# Patient Record
Sex: Male | Born: 1965 | Race: Black or African American | Hispanic: No | Marital: Single | Smoking: Never smoker
Health system: Southern US, Community
[De-identification: ages and names within clinical notes are randomized; demographics above are authoritative.]

---

## 2014-02-22 ENCOUNTER — Encounter (HOSPITAL_COMMUNITY): Payer: Self-pay | Admitting: Emergency Medicine

## 2014-02-22 ENCOUNTER — Emergency Department (HOSPITAL_COMMUNITY)
Admission: EM | Admit: 2014-02-22 | Discharge: 2014-02-22 | Disposition: A | Discharge status: Home / Self Care | Payer: 59 | Attending: Emergency Medicine | Admitting: Emergency Medicine

## 2014-02-22 DIAGNOSIS — IMO0002 Reserved for concepts with insufficient information to code with codable children: Secondary | ICD-10-CM | POA: Insufficient documentation

## 2014-02-22 DIAGNOSIS — T169XXA Foreign body in ear, unspecified ear, initial encounter: Secondary | ICD-10-CM | POA: Insufficient documentation

## 2014-02-22 DIAGNOSIS — Y939 Activity, unspecified: Secondary | ICD-10-CM | POA: Insufficient documentation

## 2014-02-22 DIAGNOSIS — T162XXA Foreign body in left ear, initial encounter: Secondary | ICD-10-CM

## 2014-02-22 DIAGNOSIS — Y929 Unspecified place or not applicable: Secondary | ICD-10-CM | POA: Diagnosis not present

## 2014-02-22 NOTE — ED Notes (Signed)
Declined W/C at D/C and was escorted to lobby by RN. 

## 2014-02-22 NOTE — ED Provider Notes (Addendum)
CSN: [id number]     Arrival date & time 02/22/14  0815 History  This chart was scribed for Elpidio Anis, PA, working with Arby Barrette, MD found by Elon Spanner, ED Scribe. This patient was seen in room TR07C/TR07C and the patient's care was started at 9:00 AM   Chief Complaint  Patient presents with  . Foreign Body in Ear   Patient is a 48 y.o. male presenting with foreign body in ear. The history is provided by the patient. No language interpreter was used.  Foreign Body in Ear This is a new problem. The current episode started 1 to 2 hours ago. The problem occurs constantly. The problem has not changed since onset.Nothing aggravates the symptoms. Nothing relieves the symptoms.  Foreign Body in Ear This is a new problem. The current episode started 1 to 2 hours ago. The problem occurs constantly. The problem has not changed since onset.Nothing aggravates the symptoms.    HPI Comments: [name] [name] is a 48 y.o. male who presents to the Emergency Department complaining of a foreign body in the left ear consisting of a Q-tip onset this morning.  Patient denies otalgia.   History reviewed. No pertinent past medical history. History reviewed. No pertinent past surgical history. History reviewed. No pertinent family history. History  Substance Use Topics  . Smoking status: Never Smoker   . Smokeless tobacco: Never Used  . Alcohol Use: Yes     Comment: social    Review of Systems  HENT:       See HPI.    Allergies  Review of patient's allergies indicates no known allergies.  Home Medications   Prior to Admission medications   Not on File   BP 168/97  Pulse 87  Temp(Src) 97.7 F (36.5 C) (Oral)  Resp 12  Ht  (1.626 m)  Wt 191 lb (86.637 kg)  BMI 32.77 kg/m2  SpO2 100% Physical Exam  Nursing note and vitals reviewed. Constitutional: He is oriented to person, place, and time. He appears well-developed and well-nourished. No distress.  HENT:  Head: Normocephalic and  atraumatic.  Left foreign body.  No bleeding in the canal.  No ear pain.  Tm post removal is unremarkable.  Tm normal. No perforation.   Eyes: Conjunctivae and EOM are normal.  Neck: Neck supple. No tracheal deviation present.  Cardiovascular: Normal rate.   Pulmonary/Chest: Effort normal. No respiratory distress.  Musculoskeletal: Normal range of motion.  Neurological: He is alert and oriented to person, place, and time.  Skin: Skin is warm and dry.  Psychiatric: He has a normal mood and affect. His behavior is normal.    ED Course  Procedures (including critical care time)  DIAGNOSTIC STUDIES: Oxygen Saturation is 100% on RA, normal by my interpretation.    COORDINATION OF CARE:  9:05 AM Discussed treatment plan with patient at bedside.  Patient acknowledges and agrees with plan.    Labs Review Labs Reviewed - No data to display  Imaging Review No results found.   EKG Interpretation None     White foreign body visualized in the left external ear canal and easily removed with forceps in whole. NO bleeding.  MDM   Final diagnoses:  None    1. Foreign body left ear, removed  Uncomplicated ear foreign body with easy removal.  I personally performed the services described in this documentation, which was scribed in my presence. The recorded information has been reviewed and is accurate.      Melvenia Beam  A Adaliz Dobis, PA-C 02/22/14 0929  Arnoldo Hooker, PA-C 04/04/14 2208

## 2014-02-22 NOTE — ED Notes (Signed)
Pt reports the cotton end of a Q-tip is in LT ear.

## 2014-02-22 NOTE — Discharge Instructions (Signed)
Ear Foreign Body °An ear foreign body is an object that is stuck in the ear. It is common for young children to put objects into the ear canal. These may include pebbles, beads, beans, and any other small objects which will fit. In adults, objects such as cotton swabs may become lodged in the ear canal. In all ages, the most common foreign bodies are insects that enter the ear canal.  °SYMPTOMS  °Foreign bodies may cause pain, buzzing or roaring sounds, hearing loss, and ear drainage.  °HOME CARE INSTRUCTIONS  °· Keep all follow-up appointments with your caregiver as told. °· Keep small objects out of reach of young children. Tell them not to put anything in their ears. °SEEK IMMEDIATE MEDICAL CARE IF:  °· You have bleeding from the ear. °· You have increased pain or swelling of the ear. °· You have reduced hearing. °· You have discharge coming from the ear. °· You have a fever. °· You have a headache. °MAKE SURE YOU:  °· Understand these instructions. °· Will watch your condition. °· Will get help right away if you are not doing well or get worse. °Document Released: 06/03/2000 Document Revised: 08/29/2011 Document Reviewed: 01/23/2008 °ExitCare® Patient Information ©2015 [employer]. This information is not intended to replace advice given to you by your health care provider. Make sure you discuss any questions you have with your health care provider. ° °

## 2014-02-25 NOTE — ED Provider Notes (Signed)
Medical screening examination/treatment/procedure(s) were performed by non-physician practitioner and as supervising physician I was immediately available for consultation/collaboration.   EKG Interpretation None       Arby Barrette, MD 02/25/14 [phone]

## 2014-04-05 NOTE — ED Provider Notes (Signed)
Medical screening examination/treatment/procedure(s) were performed by non-physician practitioner and as supervising physician I was immediately available for consultation/collaboration.   EKG Interpretation None       Arby BarretteMarcy Kynlei Piontek, MD 04/05/14 1523

## 2014-06-26 ENCOUNTER — Encounter (HOSPITAL_COMMUNITY): Payer: Self-pay | Admitting: *Deleted

## 2014-06-26 ENCOUNTER — Emergency Department (HOSPITAL_COMMUNITY): Payer: 59

## 2014-06-26 ENCOUNTER — Emergency Department (HOSPITAL_COMMUNITY): Payer: 59 | Admitting: Anesthesiology

## 2014-06-26 ENCOUNTER — Inpatient Hospital Stay (HOSPITAL_COMMUNITY)
Admission: EM | Admit: 2014-06-26 | Discharge: 2014-07-01 | DRG: 207 | Disposition: A | Discharge status: Home / Self Care | Payer: 59 | Attending: Internal Medicine | Admitting: Internal Medicine

## 2014-06-26 ENCOUNTER — Inpatient Hospital Stay (HOSPITAL_COMMUNITY): Payer: 59

## 2014-06-26 DIAGNOSIS — Z978 Presence of other specified devices: Secondary | ICD-10-CM

## 2014-06-26 DIAGNOSIS — J0431 Supraglottitis, unspecified, with obstruction: Secondary | ICD-10-CM | POA: Diagnosis present

## 2014-06-26 DIAGNOSIS — B9689 Other specified bacterial agents as the cause of diseases classified elsewhere: Secondary | ICD-10-CM | POA: Diagnosis present

## 2014-06-26 DIAGNOSIS — N170 Acute kidney failure with tubular necrosis: Secondary | ICD-10-CM | POA: Diagnosis present

## 2014-06-26 DIAGNOSIS — R739 Hyperglycemia, unspecified: Secondary | ICD-10-CM | POA: Diagnosis not present

## 2014-06-26 DIAGNOSIS — T380X5A Adverse effect of glucocorticoids and synthetic analogues, initial encounter: Secondary | ICD-10-CM | POA: Diagnosis not present

## 2014-06-26 DIAGNOSIS — Z789 Other specified health status: Secondary | ICD-10-CM

## 2014-06-26 DIAGNOSIS — J9601 Acute respiratory failure with hypoxia: Secondary | ICD-10-CM | POA: Diagnosis present

## 2014-06-26 DIAGNOSIS — R061 Stridor: Secondary | ICD-10-CM

## 2014-06-26 DIAGNOSIS — J029 Acute pharyngitis, unspecified: Secondary | ICD-10-CM | POA: Diagnosis present

## 2014-06-26 DIAGNOSIS — N179 Acute kidney failure, unspecified: Secondary | ICD-10-CM | POA: Diagnosis not present

## 2014-06-26 DIAGNOSIS — J0510 Acute epiglottitis without obstruction: Secondary | ICD-10-CM

## 2014-06-26 DIAGNOSIS — J9690 Respiratory failure, unspecified, unspecified whether with hypoxia or hypercapnia: Secondary | ICD-10-CM | POA: Diagnosis present

## 2014-06-26 DIAGNOSIS — R0602 Shortness of breath: Secondary | ICD-10-CM

## 2014-06-26 LAB — CBC WITH DIFFERENTIAL/PLATELET
Basophils Absolute: 0 10*3/uL (ref 0.0–0.1)
Basophils Relative: 0 % (ref 0–1)
Eosinophils Absolute: 0 10*3/uL (ref 0.0–0.7)
Eosinophils Relative: 0 % (ref 0–5)
HCT: 44.1 % (ref 39.0–52.0)
Hemoglobin: 14.8 g/dL (ref 13.0–17.0)
LYMPHS ABS: 2.7 10*3/uL (ref 0.7–4.0)
Lymphocytes Relative: 14 % (ref 12–46)
MCH: 27.7 pg (ref 26.0–34.0)
MCHC: 33.6 g/dL (ref 30.0–36.0)
MCV: 82.6 fL (ref 78.0–100.0)
Monocytes Absolute: 1.4 10*3/uL — ABNORMAL HIGH (ref 0.1–1.0)
Monocytes Relative: 7 % (ref 3–12)
Neutro Abs: 15.1 10*3/uL — ABNORMAL HIGH (ref 1.7–7.7)
Neutrophils Relative %: 79 % — ABNORMAL HIGH (ref 43–77)
PLATELETS: 150 10*3/uL (ref 150–400)
RBC: 5.34 MIL/uL (ref 4.22–5.81)
RDW: 15.9 % — ABNORMAL HIGH (ref 11.5–15.5)
WBC: 19.2 10*3/uL — ABNORMAL HIGH (ref 4.0–10.5)

## 2014-06-26 LAB — RAPID URINE DRUG SCREEN, HOSP PERFORMED
Amphetamines: NOT DETECTED
BENZODIAZEPINES: NOT DETECTED
Barbiturates: NOT DETECTED
Cocaine: NOT DETECTED
OPIATES: NOT DETECTED
Tetrahydrocannabinol: NOT DETECTED

## 2014-06-26 LAB — I-STAT ARTERIAL BLOOD GAS, ED
Acid-base deficit: 2 mmol/L (ref 0.0–2.0)
Bicarbonate: 23.3 mEq/L (ref 20.0–24.0)
O2 Saturation: 100 %
PH ART: 7.339 — AB (ref 7.350–7.450)
PO2 ART: 226 mmHg — AB (ref 80.0–100.0)
Patient temperature: 100
TCO2: 25 mmol/L (ref 0–100)
pCO2 arterial: 43.8 mmHg (ref 35.0–45.0)

## 2014-06-26 LAB — COMPREHENSIVE METABOLIC PANEL
ALBUMIN: 4.2 g/dL (ref 3.5–5.2)
ALT: 54 U/L — ABNORMAL HIGH (ref 0–53)
ANION GAP: 10 (ref 5–15)
AST: 46 U/L — ABNORMAL HIGH (ref 0–37)
Alkaline Phosphatase: 81 U/L (ref 39–117)
BUN: 9 mg/dL (ref 6–23)
CHLORIDE: 102 meq/L (ref 96–112)
CO2: 26 mmol/L (ref 19–32)
Calcium: 9.4 mg/dL (ref 8.4–10.5)
Creatinine, Ser: 1.18 mg/dL (ref 0.50–1.35)
GFR calc non Af Amer: 70 mL/min — ABNORMAL LOW (ref >90)
GFR, EST AFRICAN AMERICAN: 82 mL/min — AB (ref >90)
GLUCOSE: 167 mg/dL — AB (ref 70–99)
POTASSIUM: 3.7 mmol/L (ref 3.5–5.1)
Sodium: 138 mmol/L (ref 135–145)
TOTAL PROTEIN: 8.6 g/dL — AB (ref 6.0–8.3)
Total Bilirubin: 1.2 mg/dL (ref 0.3–1.2)

## 2014-06-26 LAB — URINALYSIS, ROUTINE W REFLEX MICROSCOPIC
Bilirubin Urine: NEGATIVE
Glucose, UA: 250 mg/dL — AB
Ketones, ur: 15 mg/dL — AB
Leukocytes, UA: NEGATIVE
Nitrite: NEGATIVE
Protein, ur: >300 mg/dL — AB
Specific Gravity, Urine: 1.017 (ref 1.005–1.030)
Urobilinogen, UA: 0.2 mg/dL (ref 0.0–1.0)
pH: 6.5 (ref 5.0–8.0)

## 2014-06-26 LAB — POCT I-STAT 3, ART BLOOD GAS (G3+)
Acid-base deficit: 5 mmol/L — ABNORMAL HIGH (ref 0.0–2.0)
Bicarbonate: 19.6 mEq/L — ABNORMAL LOW (ref 20.0–24.0)
O2 Saturation: 96 %
PH ART: 7.365 (ref 7.350–7.450)
Patient temperature: 98.1
TCO2: 21 mmol/L (ref 0–100)
pCO2 arterial: 34.2 mmHg — ABNORMAL LOW (ref 35.0–45.0)
pO2, Arterial: 86 mmHg (ref 80.0–100.0)

## 2014-06-26 LAB — URINE MICROSCOPIC-ADD ON

## 2014-06-26 LAB — LACTIC ACID, PLASMA
Lactic Acid, Venous: 3 mmol/L — ABNORMAL HIGH (ref 0.5–2.2)
Lactic Acid, Venous: 2.3 mmol/L — ABNORMAL HIGH (ref 0.5–2.2)

## 2014-06-26 LAB — BRAIN NATRIURETIC PEPTIDE: B NATRIURETIC PEPTIDE 5: 96.9 pg/mL (ref 0.0–100.0)

## 2014-06-26 LAB — PROCALCITONIN
Procalcitonin: 1.38 ng/mL
Procalcitonin: 6.99 ng/mL

## 2014-06-26 LAB — MAGNESIUM: Magnesium: 1.9 mg/dL (ref 1.5–2.5)

## 2014-06-26 LAB — GLUCOSE, CAPILLARY
GLUCOSE-CAPILLARY: 165 mg/dL — AB (ref 70–99)
GLUCOSE-CAPILLARY: 137 mg/dL — AB (ref 70–99)
Glucose-Capillary: 134 mg/dL — ABNORMAL HIGH (ref 70–99)

## 2014-06-26 LAB — MRSA PCR SCREENING: MRSA BY PCR: NEGATIVE

## 2014-06-26 LAB — TROPONIN I: Troponin I: 0.34 ng/mL — ABNORMAL HIGH (ref <0.031)

## 2014-06-26 LAB — APTT: APTT: 26 s (ref 24–37)

## 2014-06-26 LAB — PROTIME-INR
INR: 1.23 (ref 0.00–1.49)
Prothrombin Time: 15.7 seconds — ABNORMAL HIGH (ref 11.6–15.2)

## 2014-06-26 LAB — CORTISOL: Cortisol, Plasma: 96.2 ug/dL

## 2014-06-26 LAB — TRIGLYCERIDES: Triglycerides: 80 mg/dL (ref <150)

## 2014-06-26 LAB — PHOSPHORUS: PHOSPHORUS: 3.9 mg/dL (ref 2.3–4.6)

## 2014-06-26 MED ORDER — RACEPINEPHRINE HCL 2.25 % IN NEBU
0.5000 mL | INHALATION_SOLUTION | Freq: Once | RESPIRATORY_TRACT | Status: AC
  Administered 2014-06-26: 0.5 mL via RESPIRATORY_TRACT
  Filled 2014-06-26: qty 0.5

## 2014-06-26 MED ORDER — METHYLPREDNISOLONE SODIUM SUCC 125 MG IJ SOLR: 80.0000 mg | Freq: Two times a day (BID) | INTRAMUSCULAR | Status: DC

## 2014-06-26 MED ORDER — VANCOMYCIN HCL 10 G IV SOLR
1250.0000 mg | Freq: Two times a day (BID) | INTRAVENOUS | Status: DC
  Administered 2014-06-26 – 2014-06-27 (×2): 1250 mg via INTRAVENOUS
  Filled 2014-06-26 (×2): qty 1250

## 2014-06-26 MED ORDER — HEPARIN SODIUM (PORCINE) 5000 UNIT/ML IJ SOLN
5000.0000 [IU] | Freq: Three times a day (TID) | INTRAMUSCULAR | Status: DC
  Administered 2014-06-26 – 2014-06-28 (×5): 5000 [IU] via SUBCUTANEOUS
  Filled 2014-06-26 (×8): qty 1

## 2014-06-26 MED ORDER — PHENYLEPHRINE HCL 10 MG/ML IJ SOLN
0.0000 ug/min | INTRAMUSCULAR | Status: DC
  Administered 2014-06-26: 20 ug/min via INTRAVENOUS
  Filled 2014-06-26: qty 1

## 2014-06-26 MED ORDER — SODIUM CHLORIDE 0.9 % IV SOLN
25.0000 ug/h | INTRAVENOUS | Status: DC
  Administered 2014-06-26: 25 ug/h via INTRAVENOUS
  Filled 2014-06-26: qty 50

## 2014-06-26 MED ORDER — ETOMIDATE 2 MG/ML IV SOLN
INTRAVENOUS | Status: AC
  Filled 2014-06-26: qty 20

## 2014-06-26 MED ORDER — ROCURONIUM BROMIDE 50 MG/5ML IV SOLN
INTRAVENOUS | Status: AC
  Filled 2014-06-26: qty 2

## 2014-06-26 MED ORDER — FENTANYL CITRATE 0.05 MG/ML IJ SOLN: 100.0000 ug | INTRAMUSCULAR | Status: DC | PRN

## 2014-06-26 MED ORDER — SODIUM CHLORIDE 0.9 % IV SOLN
INTRAVENOUS | Status: DC
  Administered 2014-06-26 (×2): via INTRAVENOUS

## 2014-06-26 MED ORDER — DOPAMINE-DEXTROSE 3.2-5 MG/ML-% IV SOLN
INTRAVENOUS | Status: AC
  Administered 2014-06-26: 3 ug/kg/min via INTRAVENOUS
  Filled 2014-06-26: qty 250

## 2014-06-26 MED ORDER — CHLORHEXIDINE GLUCONATE 0.12 % MT SOLN
15.0000 mL | Freq: Two times a day (BID) | OROMUCOSAL | Status: DC
  Administered 2014-06-26 – 2014-06-27 (×3): 15 mL via OROMUCOSAL
  Filled 2014-06-26 (×2): qty 15

## 2014-06-26 MED ORDER — LIDOCAINE HCL (CARDIAC) 20 MG/ML IV SOLN
INTRAVENOUS | Status: AC
  Filled 2014-06-26: qty 5

## 2014-06-26 MED ORDER — CETYLPYRIDINIUM CHLORIDE 0.05 % MT LIQD
7.0000 mL | Freq: Two times a day (BID) | OROMUCOSAL | Status: DC
  Administered 2014-06-27 (×2): 7 mL via OROMUCOSAL

## 2014-06-26 MED ORDER — METHYLPREDNISOLONE SODIUM SUCC 125 MG IJ SOLR
125.0000 mg | INTRAMUSCULAR | Status: AC
  Administered 2014-06-26: 125 mg via INTRAVENOUS
  Filled 2014-06-26: qty 2

## 2014-06-26 MED ORDER — PROPOFOL 10 MG/ML IV BOLUS
INTRAVENOUS | Status: DC | PRN
  Administered 2014-06-26: 170 mg via INTRAVENOUS

## 2014-06-26 MED ORDER — METHYLPREDNISOLONE SODIUM SUCC 125 MG IJ SOLR
80.0000 mg | Freq: Four times a day (QID) | INTRAMUSCULAR | Status: DC
  Administered 2014-06-26 – 2014-06-27 (×3): 80 mg via INTRAVENOUS
  Filled 2014-06-26 (×2): qty 1.28
  Filled 2014-06-26: qty 2
  Filled 2014-06-26 (×2): qty 1.28
  Filled 2014-06-26 (×2): qty 2

## 2014-06-26 MED ORDER — PROPOFOL 10 MG/ML IV EMUL
INTRAVENOUS | Status: AC
  Filled 2014-06-26: qty 100

## 2014-06-26 MED ORDER — SUCCINYLCHOLINE CHLORIDE 20 MG/ML IJ SOLN
INTRAMUSCULAR | Status: DC | PRN
  Administered 2014-06-26: 140 mg via INTRAVENOUS

## 2014-06-26 MED ORDER — IOHEXOL 300 MG/ML  SOLN
75.0000 mL | Freq: Once | INTRAMUSCULAR | Status: AC | PRN
  Administered 2014-06-26: 75 mL via INTRAVENOUS

## 2014-06-26 MED ORDER — ALBUTEROL SULFATE (2.5 MG/3ML) 0.083% IN NEBU: 2.5000 mg | INHALATION_SOLUTION | RESPIRATORY_TRACT | Status: DC | PRN

## 2014-06-26 MED ORDER — DOPAMINE-DEXTROSE 3.2-5 MG/ML-% IV SOLN
0.0000 ug/kg/min | INTRAVENOUS | Status: DC
  Administered 2014-06-26: 3 ug/kg/min via INTRAVENOUS

## 2014-06-26 MED ORDER — PROPOFOL 10 MG/ML IV BOLUS
INTRAVENOUS | Status: AC | PRN
  Administered 2014-06-26: 170 ug via INTRAVENOUS

## 2014-06-26 MED ORDER — PANTOPRAZOLE SODIUM 40 MG IV SOLR: 40.0000 mg | Freq: Every day | INTRAVENOUS | Status: DC

## 2014-06-26 MED ORDER — SUCCINYLCHOLINE CHLORIDE 20 MG/ML IJ SOLN
INTRAMUSCULAR | Status: AC | PRN
  Administered 2014-06-26: 140 mg via INTRAVENOUS

## 2014-06-26 MED ORDER — PROPOFOL 10 MG/ML IV EMUL
5.0000 ug/kg/min | INTRAVENOUS | Status: DC
  Filled 2014-06-26: qty 100

## 2014-06-26 MED ORDER — PIPERACILLIN-TAZOBACTAM 3.375 G IVPB
3.3750 g | Freq: Three times a day (TID) | INTRAVENOUS | Status: DC
  Administered 2014-06-26: 3.375 g via INTRAVENOUS
  Filled 2014-06-26 (×2): qty 50

## 2014-06-26 MED ORDER — PROPOFOL 10 MG/ML IV EMUL
5.0000 ug/kg/min | INTRAVENOUS | Status: DC
Start: 2014-06-26 — End: 2014-06-26
  Administered 2014-06-26: 25 ug/kg/min via INTRAVENOUS
  Administered 2014-06-26: 5 ug/kg/min via INTRAVENOUS

## 2014-06-26 MED ORDER — FAMOTIDINE IN NACL 20-0.9 MG/50ML-% IV SOLN
20.0000 mg | Freq: Two times a day (BID) | INTRAVENOUS | Status: DC
  Administered 2014-06-26 – 2014-06-28 (×4): 20 mg via INTRAVENOUS
  Filled 2014-06-26 (×6): qty 50

## 2014-06-26 MED ORDER — SODIUM CHLORIDE 0.9 % IV BOLUS (SEPSIS)
1000.0000 mL | Freq: Once | INTRAVENOUS | Status: AC
  Administered 2014-06-26: 1000 mL via INTRAVENOUS

## 2014-06-26 MED ORDER — PANTOPRAZOLE SODIUM 40 MG IV SOLR
40.0000 mg | Freq: Every day | INTRAVENOUS | Status: DC
  Administered 2014-06-26: 40 mg via INTRAVENOUS
  Filled 2014-06-26 (×2): qty 40

## 2014-06-26 MED ORDER — SUCCINYLCHOLINE CHLORIDE 20 MG/ML IJ SOLN
INTRAMUSCULAR | Status: AC
  Filled 2014-06-26: qty 1

## 2014-06-26 MED ORDER — SODIUM CHLORIDE 0.9 % IV BOLUS (SEPSIS)
500.0000 mL | Freq: Once | INTRAVENOUS | Status: AC
  Administered 2014-06-26: 500 mL via INTRAVENOUS

## 2014-06-26 MED ORDER — VANCOMYCIN HCL 10 G IV SOLR
1250.0000 mg | INTRAVENOUS | Status: AC
  Administered 2014-06-26: 1250 mg via INTRAVENOUS
  Filled 2014-06-26: qty 1250

## 2014-06-26 MED ORDER — PIPERACILLIN-TAZOBACTAM 3.375 G IVPB 30 MIN
3.3750 g | INTRAVENOUS | Status: AC
  Administered 2014-06-26: 3.375 g via INTRAVENOUS
  Filled 2014-06-26: qty 50

## 2014-06-26 MED ORDER — PROPOFOL 10 MG/ML IV EMUL
0.0000 ug/kg/min | INTRAVENOUS | Status: DC
  Administered 2014-06-26: 35 ug/kg/min via INTRAVENOUS
  Administered 2014-06-26: 15 ug/kg/min via INTRAVENOUS
  Administered 2014-06-27: 5 ug/kg/min via INTRAVENOUS
  Filled 2014-06-26 (×3): qty 100

## 2014-06-26 NOTE — Progress Notes (Signed)
ANTIBIOTIC CONSULT NOTE - INITIAL  Pharmacy Consult for vancomycin and zosyn Indication: epiglottis  Allergies no known allergies  Patient Measurements: Height: 5\' 8"  (172.7 cm) Weight: 209 lb 7 oz (95 kg) IBW/kg (Calculated) : 68.4   Vital Signs: Temp: 100 F (37.8 C) (01/07 0919) Temp Source: Oral (01/07 0919) BP: 144/74 mmHg (01/07 0915) Pulse Rate: 99 (01/07 0915) Intake/Output from previous day:   Intake/Output from this shift: Total I/O In: 900 [I.V.:900] Out: 250 [Urine:250]  Labs:  Recent Labs  06/26/14 0800  WBC 19.2*  HGB 14.8  PLT 150  CREATININE 1.18   Estimated Creatinine Clearance: 83.7 mL/min (by C-G formula based on Cr of 1.18). No results for input(s): VANCOTROUGH, VANCOPEAK, VANCORANDOM, GENTTROUGH, GENTPEAK, GENTRANDOM, TOBRATROUGH, TOBRAPEAK, TOBRARND, AMIKACINPEAK, AMIKACINTROU, AMIKACIN in the last 72 hours.   Microbiology: No results found for this or any previous visit (from the past 720 hour(s)).  Medical History: History reviewed. No pertinent past medical history.  Medications:  See medication history Assessment: 49 yo man presents with stridor and sore throat.  He was intubated in the ED and broad spectrum antibiotics initiated for epiglottis.    Goal of Therapy:  Vancomycin trough level 10-15 mcg/ml  Plan:  Cont zosyn 3.375 gm IV q8 hours Cont vancomycin 1250 mg IV q12 hours Monitor renal function, clinical course and cultures. Vanc level when appropriate  Thanks for allowing pharmacy to be a part of this patient's care.  Talbert CageLora Danahi Reddish, PharmD Clinical Pharmacist, [phone] 06/26/2014,10:17 AM

## 2014-06-26 NOTE — Progress Notes (Signed)
UR Completed.  [name] [name]  

## 2014-06-26 NOTE — Progress Notes (Signed)
Patient is hypotensive. MD notified. Order received via telephone for 1L NS bolus and Neo-synephrine.

## 2014-06-26 NOTE — Progress Notes (Signed)
Patient going for CT scan. Vitals are stable. Dopamine gtt infusing. Patient is awake and calm. RT notified.

## 2014-06-26 NOTE — ED Notes (Signed)
Pt was noted to have spO2 at 50% with good wave form. MD examined throat prior to sats dropping. Pt was respositioned. sats increased. MD aware. Pt moved to trauma c

## 2014-06-26 NOTE — Anesthesia Postprocedure Evaluation (Signed)
  Anesthesia Post-op Note  Patient: [name] [name]  Procedure(s) Performed: * No procedures listed *  Patient Location: ED  Anesthesia Type:General  Level of Consciousness: sedated  Airway and Oxygen Therapy: Patient remains intubated per anesthesia plan and Patient placed on Ventilator (see vital sign flow sheet for setting)  Post-op Pain: none  Post-op Assessment: Post-op Vital signs reviewed, Patient's Cardiovascular Status Stable, Respiratory Function Stable and Patent Airway  Post-op Vital Signs: Reviewed and stable  Last Vitals:  Filed Vitals:   06/26/14 0919  BP:   Pulse:   Temp: 37.8 C  Resp:     Complications: No apparent anesthesia complications

## 2014-06-26 NOTE — ED Notes (Signed)
RT called to pt room, RN stated pt had difficulty swallowing, pt slightly diaphoretic and coarse sounding almost like snores, RT attempted to suction pt mouth but couldn't get anything but small thin clear. Pt was 52% with good pleth on 2L [place], RT placed pt on NRB and pt spo2 rose to 99%. After pt received Racemic epi, pt coughed up mod thick pink/tan glob of mucous. MD attempted pt off NRB, pt desat to 87% from 100% within a minute. Pt placed back on NRB and back to 99%.

## 2014-06-26 NOTE — Transfer of Care (Signed)
Immediate Anesthesia Transfer of Care Note  Patient: [name] [name]  Procedure(s) Performed: * No procedures listed *  Patient Location: ER  Anesthesia Type:General  Level of Consciousness: sedated  Airway & Oxygen Therapy: Patient remains intubated per anesthesia plan and Patient placed on Ventilator (see vital sign flow sheet for setting)  Post-op Assessment: VS stable in ER trauma c with RN & RT  Post vital signs: Reviewed and stable  Complications: No apparent anesthesia complications

## 2014-06-26 NOTE — Progress Notes (Signed)
Patient is bradycardic. MD notified Tyson Alias(Feinstein) and verbal order received to start dopamine gtt and titrate off Neo. ABG requested.

## 2014-06-26 NOTE — ED Notes (Signed)
MD at bedside. Brett CanalesSteve Minor with critical care.

## 2014-06-26 NOTE — ED Provider Notes (Signed)
CSN: [id number]     Arrival date & time 06/26/14  [id number] History   First MD Initiated Contact with Patient 06/26/14 [phone]     Chief Complaint  Patient presents with  . Respiratory Distress     (Consider location/radiation/quality/duration/timing/severity/associated sxs/prior Treatment) Patient is a 49 y.o. male presenting with shortness of breath. The history is provided by the patient.  Shortness of Breath Severity:  Moderate Onset quality:  Gradual Timing:  Constant Progression:  Worsening Chronicity:  New Context: URI   Relieved by:  Nothing Worsened by:  Nothing tried Ineffective treatments:  None tried Associated symptoms: sore throat   Associated symptoms: no abdominal pain, no chest pain, no cough, no fever, no headaches, no neck pain and no vomiting     History reviewed. No pertinent past medical history. History reviewed. No pertinent past surgical history. No family history on file. History  Substance Use Topics  . Smoking status: Unknown If Ever Smoked  . Smokeless tobacco: Not on file  . Alcohol Use: Not on file    Review of Systems  Constitutional: Negative for fever.  HENT: Positive for sore throat. Negative for drooling and rhinorrhea.   Eyes: Negative for pain.  Respiratory: Positive for shortness of breath. Negative for cough.   Cardiovascular: Negative for chest pain and leg swelling.  Gastrointestinal: Negative for nausea, vomiting, abdominal pain and diarrhea.  Genitourinary: Negative for dysuria and hematuria.  Musculoskeletal: Negative for gait problem and neck pain.  Skin: Negative for color change.  Neurological: Negative for numbness and headaches.  Hematological: Negative for adenopathy.  Psychiatric/Behavioral: Negative for behavioral problems.  All other systems reviewed and are negative.     Allergies  Review of patient's allergies indicates no known allergies.  Home Medications   Prior to Admission medications   Not on File   BP  209/173 mmHg  Pulse 108  Resp 22  SpO2 100% Physical Exam  Constitutional: He is oriented to person, place, and time. He appears well-developed and well-nourished.  HENT:  Head: Normocephalic and atraumatic.  Right Ear: External ear normal.  Left Ear: External ear normal.  Nose: Nose normal.  Mouth/Throat: No oropharyngeal exudate.  Symmetric appearing, mildly erythematous posterior tonsils.  No trismus.  No uvular deviation.  Respiratory stridor noted on exam.  Eyes: Conjunctivae and EOM are normal. Pupils are equal, round, and reactive to light.  Neck: Normal range of motion. Neck supple.  Cardiovascular: Normal rate, regular rhythm, normal heart sounds and intact distal pulses.  Exam reveals no gallop and no friction rub.   No murmur heard. Pulmonary/Chest: He is in respiratory distress. He has no wheezes.  Abdominal: Soft. Bowel sounds are normal. He exhibits no distension. There is no tenderness. There is no rebound and no guarding.  Musculoskeletal: Normal range of motion. He exhibits no edema or tenderness.  Neurological: He is alert and oriented to person, place, and time.  Skin: Skin is warm and dry.  Psychiatric: He has a normal mood and affect. His behavior is normal.  Nursing note and vitals reviewed.   ED Course  Procedures (including critical care time) Labs Review Labs Reviewed  CBC WITH DIFFERENTIAL - Abnormal; Notable for the following:    WBC 19.2 (*)    RDW 15.9 (*)    Neutrophils Relative % 79 (*)    Neutro Abs 15.1 (*)    Monocytes Absolute 1.4 (*)    All other components within normal limits  COMPREHENSIVE METABOLIC PANEL - Abnormal; Notable for the  following:    Glucose, Bld 167 (*)    Total Protein 8.6 (*)    AST 46 (*)    ALT 54 (*)    GFR calc non Af Amer 70 (*)    GFR calc Af Amer 82 (*)    All other components within normal limits  LACTIC ACID, PLASMA - Abnormal; Notable for the following:    Lactic Acid, Venous 3.0 (*)    All other  components within normal limits  URINALYSIS, ROUTINE W REFLEX MICROSCOPIC - Abnormal; Notable for the following:    Glucose, UA 250 (*)    Hgb urine dipstick MODERATE (*)    Ketones, ur 15 (*)    Protein, ur >300 (*)    All other components within normal limits  TROPONIN I - Abnormal; Notable for the following:    Troponin I 0.34 (*)    All other components within normal limits  PROTIME-INR - Abnormal; Notable for the following:    Prothrombin Time 15.7 (*)    All other components within normal limits  URINE MICROSCOPIC-ADD ON - Abnormal; Notable for the following:    Bacteria, UA FEW (*)    Casts GRANULAR CAST (*)    All other components within normal limits  LACTIC ACID, PLASMA - Abnormal; Notable for the following:    Lactic Acid, Venous 2.3 (*)    All other components within normal limits  GLUCOSE, CAPILLARY - Abnormal; Notable for the following:    Glucose-Capillary 165 (*)    All other components within normal limits  I-STAT ARTERIAL BLOOD GAS, ED - Abnormal; Notable for the following:    pH, Arterial 7.339 (*)    pO2, Arterial 226.0 (*)    All other components within normal limits  MRSA PCR SCREENING  CULTURE, BLOOD (ROUTINE X 2)  CULTURE, BLOOD (ROUTINE X 2)  URINE CULTURE  CULTURE, RESPIRATORY (NON-EXPECTORATED)  BRAIN NATRIURETIC PEPTIDE  URINE RAPID DRUG SCREEN (HOSP PERFORMED)  MAGNESIUM  PHOSPHORUS  PROCALCITONIN  APTT  TRIGLYCERIDES  PROCALCITONIN  CORTISOL  STREP PNEUMONIAE URINARY ANTIGEN  LEGIONELLA ANTIGEN, URINE  BLOOD GAS, ARTERIAL    Imaging Review Dg Chest Port 1 View  06/26/2014   CLINICAL DATA:  49 year old male with shortness of breath and sore throat. Initial encounter.  EXAM: PORTABLE CHEST - 1 VIEW  COMPARISON:  None.  FINDINGS: Mild pulmonary vascular congestion. Left base septal lines may indicate changes of early congestive heart failure.  Cardiomegaly.  Tortuous aorta.  Limited evaluation of mediastinal and hilar structures secondary to  technique and tortuous aorta. Patient would benefit from two view chest with better inspiration and cardiac leads removed.  No gross pneumothorax.  IMPRESSION: Mild pulmonary vascular congestion. Left base septal lines may indicate changes of early congestive heart failure.  Cardiomegaly.  Tortuous aorta.  Limited evaluation of mediastinal and hilar structures secondary to technique and tortuous aorta. Patient would benefit from two view chest with better inspiration and cardiac leads removed.   Electronically Signed   By: Bridgett Larsson M.D.   On: 06/26/2014 08:10     EKG Interpretation   Date/Time:  Thursday June 26 2014 07:48:25 EST Ventricular Rate:  120 PR Interval:  176 QRS Duration: 99 QT Interval:  312 QTC Calculation: 441 R Axis:   34 Text Interpretation:  Sinus tachycardia LAE, consider biatrial enlargement  RSR' in V1 or V2, right VCD or RVH LVH with secondary repolarization  abnormality Artifact in lead(s) II III aVL aVF V5 V6 No previous ecg  for  comparison Confirmed by Larayne Baxley  MD, Mujahid Jalomo [id number]) on 06/26/2014 10:15:26  AM     CRITICAL CARE Performed by: Purvis Sheffield, S Total critical care time: 1 hour Critical care time was exclusive of separately billable procedures and treating other patients. Critical care was necessary to treat or prevent imminent or life-threatening deterioration. Critical care was time spent personally by me on the following activities: development of treatment plan with patient and/or surrogate as well as nursing, discussions with consultants, evaluation of patient's response to treatment, examination of patient, obtaining history from patient or surrogate, ordering and performing treatments and interventions, ordering and review of laboratory studies, ordering and review of radiographic studies, pulse oximetry and re-evaluation of patient's condition. 48 MDM   Final diagnoses:  Sore throat  SOB (shortness of breath)  Endotracheally  intubated  Acute respiratory failure with hypoxia  Acute epiglottiditis  Stridor    8:29 AM 49 y.o. male who presents with difficulty breathing which began this morning. It is difficult to elicit a history as he has trouble speaking. He states that he has had a sore throat for 2 days and began having difficulty breathing this morning. He has stridor on exam. He has some mild tonsillar swelling bilaterally but not asymmetric and no uvular deviation on my exam. He is tachycardic and tachypneic. He is hypoxic with an O2 sat in the 80s on room air. He is hypertensive on exam. We'll give soy Medrol and a racemic epi. We'll start with screening plain film imaging and lab work. Will discuss the case with ENT.  Broadened workup to inclued bld/urine cx's, vanc/zosyn.  Pt w/ worsening respiratory distress and decision was made to intubate. Anesthesia intubated the pt w/ noted enlarged epiglottis on glidescope (See their note). ENT consulted who has seen the pt. Will admit to critical care.   Purvis Sheffield, MD 06/26/14 1550

## 2014-06-26 NOTE — Code Documentation (Signed)
Stephannie Petersabatha, RN with anestesia at bedside preparing medications

## 2014-06-26 NOTE — H&P (Signed)
PULMONARY / CRITICAL CARE MEDICINE   Name: [name] [name] MRN: [id number] DOB: 1965-12-11    ADMISSION DATE:  06/26/2014   REFERRING MD :  EDP  CHIEF COMPLAINT:  Cant breath  INITIAL PRESENTATION: Stridor, sorethroat  STUDIES:  1/7 ct neck>>  SIGNIFICANT EVENTS: 1/7 intubated by anesthesia for stridor 1/7 ENT consult Dr. Jenne Pane  HISTORY OF PRESENT ILLNESS:  49 yo aam from [place]  who presented to [facility] ED am 06/26/14 with 2 days of sore throat, stridor and hypoxia. Anesthesia was called to place #7.0 OTT urgently and ENT(Dr. Jenne Pane) was consulted. Empirical abx with vanc/zoysn were  instituted along with solumedrol. Cords were reported to be edematous and he will need ICU/Vent/abx/sedation for several days and most likely will need extubation in OR with ENT present  PAST MEDICAL HISTORY :   has no past medical history on file.  has no past surgical history on file. Prior to Admission medications   Not on File   Allergies no known allergies  FAMILY HISTORY:  has no family status information on file.  SOCIAL HISTORY:    REVIEW OF SYSTEMS:  NA  SUBJECTIVE:   VITAL SIGNS: Temp:  [100 F (37.8 C)] 100 F (37.8 C) (01/07 0919) Pulse Rate:  [96-114] 99 (01/07 0915) Resp:  [14-30] 24 (01/07 0915) BP: (143-223)/(73-173) 144/74 mmHg (01/07 0915) SpO2:  [89 %-100 %] 100 % (01/07 0915) FiO2 (%):  [100 %] 100 % (01/07 0855) Weight:  [209 lb 7 oz (95 kg)] 209 lb 7 oz (95 kg) (01/07 0906) HEMODYNAMICS:   VENTILATOR SETTINGS: Vent Mode:  [-] PRVC FiO2 (%):  [100 %] 100 % Set Rate:  [14 bmp] 14 bmp Vt Set:  [550 mL] 550 mL PEEP:  [5 cmH20] 5 cmH20 Plateau Pressure:  [36 cmH20] 36 cmH20 INTAKE / OUTPUT: No intake or output data in the 24 hours ending 06/26/14 0947  PHYSICAL EXAMINATION: General:  WNWDAAM sedated on vent Neuro: Follows commands despite high dose diprivan HEENT:  OTT-> vent Cardiovascular:  HSR RRR Lungs: CTA Abdomen:  Oft BS Musculoskeletal:  intact Skin: warm an d dry  LABS:  CBC  Recent Labs Lab 06/26/14 0800  WBC 19.2*  HGB 14.8  HCT 44.1  PLT 150   Coag's No results for input(s): APTT, INR in the last 168 hours. BMET  Recent Labs Lab 06/26/14 0800  NA 138  K 3.7  CL 102  CO2 26  BUN 9  CREATININE 1.18  GLUCOSE 167*   Electrolytes  Recent Labs Lab 06/26/14 0800  CALCIUM 9.4   Sepsis Markers No results for input(s): LATICACIDVEN, PROCALCITON, O2SATVEN in the last 168 hours. ABG No results for input(s): PHART, PCO2ART, PO2ART in the last 168 hours. Liver Enzymes  Recent Labs Lab 06/26/14 0800  AST 46*  ALT 54*  ALKPHOS 81  BILITOT 1.2  ALBUMIN 4.2   Cardiac Enzymes No results for input(s): TROPONINI, PROBNP in the last 168 hours. Glucose No results for input(s): GLUCAP in the last 168 hours.  Imaging No results found.   ASSESSMENT / PLAN:  PULMONARY OETT 1/7 for stridor>> A: Stridor from presumed epiglottis  P:   Vent bundle Sedation Abx Steroids ICU Extubate in OR with ENT back up  ct neck  CARDIOVASCULAR CVL A:  No acute issue P:    RENAL A:   No acute issue P:     GASTROINTESTINAL A:   GI protection P:   PPUI  HEMATOLOGIC A:  \ No acute issue P:  INFECTIOUS A: \ Presumed epiglottis  P:   BCx2 1/7>> UC 1/7 >> Sputum 1/7 1/7 vanc>> 1/7 zoysn>>  ENDOCRINE A:   No acute issue P:   Monitor glucose  NEUROLOGIC A: \ Intact prior to intubation P:   RASS goal:-1 while intubated Diprivan/fentanyl Check UDS  FAMILY  - Updates: No family available  - Inter-disciplinary family meet or Palliative Care meeting due by:  day 7    TODAY'S SUMMARY:  49 yo a[place] from [place] who [facility] with 2 days of sore throat, stridor and hypoxia. Anesthesia was called to place #7.0 OTT urgently and ENT(Dr. Jenne PaneBates) was consulted. Empirical abx with vanc/zoysn were  instituted along with solumedrol. Cords were reported to be  edematous and he will need ICU/Vent/abx/sedation for several days and most likely will need extubation in OR with ENT present.      Brett CanalesSteve Minor ACNP [facility] Pager [phone] till 3 pm If no answer page [phone] 06/26/2014, 9:48 AM

## 2014-06-26 NOTE — ED Notes (Signed)
Pt states that he has had a sore throat, difficulty swallowing, feels like something is in his throat. Pt has muffled voice.

## 2014-06-26 NOTE — Progress Notes (Signed)
Sedation has been weaned down and pt is awake and alert. He remains calm and nods his head appropriately. He moves all extremities and has a strong grip.

## 2014-06-26 NOTE — ED Notes (Signed)
X-ray at bedside

## 2014-06-26 NOTE — ED Notes (Signed)
Xray called for portable neck

## 2014-06-26 NOTE — ED Notes (Signed)
NRB removed and spO2 dropped to 86% NRB replaced. Resident at bedside

## 2014-06-26 NOTE — ED Notes (Signed)
Attempted report 

## 2014-06-26 NOTE — Consult Note (Signed)
Reason for Consult:Respiratory distress Referring Physician: ER  [name] [name] is an 49 y.o. male.  HPI: 49 year old male with two day history of sore throat developed difficulty breathing this morning and came to the ER.  Difficulty talking due to shortness of breath.  Due to hypoxia, stridor, and difficulty breathing, the patient was electively intubated in the ER by anesthesia.  I spoke to the anesthesiologist who said the epiglottis was quite inflamed and thickened and he could not visualize the glottis.  Intubation was successful and he is now sedated and mechanically ventilated.  History reviewed. No pertinent past medical history.  History reviewed. No pertinent past surgical history.  No family history on file.  Social History:  has no tobacco, alcohol, and drug history on file.  Allergies: Allergies no known allergies  Medications: I have reviewed the patient's current medications.  Results for orders placed or performed during the hospital encounter of 06/26/14 (from the past 48 hour(s))  CBC with Differential     Status: Abnormal   Collection Time: 06/26/14  8:00 AM  Result Value Ref Range   WBC 19.2 (H) 4.0 - 10.5 K/uL   RBC 5.34 4.22 - 5.81 MIL/uL   Hemoglobin 14.8 13.0 - 17.0 g/dL   HCT 44.1 39.0 - 52.0 %   MCV 82.6 78.0 - 100.0 fL   MCH 27.7 26.0 - 34.0 pg   MCHC 33.6 30.0 - 36.0 g/dL   RDW 15.9 (H) 11.5 - 15.5 %   Platelets 150 150 - 400 K/uL   Neutrophils Relative % 79 (H) 43 - 77 %   Neutro Abs 15.1 (H) 1.7 - 7.7 K/uL   Lymphocytes Relative 14 12 - 46 %   Lymphs Abs 2.7 0.7 - 4.0 K/uL   Monocytes Relative 7 3 - 12 %   Monocytes Absolute 1.4 (H) 0.1 - 1.0 K/uL   Eosinophils Relative 0 0 - 5 %   Eosinophils Absolute 0.0 0.0 - 0.7 K/uL   Basophils Relative 0 0 - 1 %   Basophils Absolute 0.0 0.0 - 0.1 K/uL  Comprehensive metabolic panel     Status: Abnormal   Collection Time: 06/26/14  8:00 AM  Result Value Ref Range   Sodium 138 135 - 145 mmol/L    Comment:  Please note change in reference range.   Potassium 3.7 3.5 - 5.1 mmol/L    Comment: Please note change in reference range.   Chloride 102 96 - 112 mEq/L   CO2 26 19 - 32 mmol/L   Glucose, Bld 167 (H) 70 - 99 mg/dL   BUN 9 6 - 23 mg/dL   Creatinine, Ser 1.18 0.50 - 1.35 mg/dL   Calcium 9.4 8.4 - 10.5 mg/dL   Total Protein 8.6 (H) 6.0 - 8.3 g/dL   Albumin 4.2 3.5 - 5.2 g/dL   AST 46 (H) 0 - 37 U/L   ALT 54 (H) 0 - 53 U/L   Alkaline Phosphatase 81 39 - 117 U/L   Total Bilirubin 1.2 0.3 - 1.2 mg/dL   GFR calc non Af Amer 70 (L) >90 mL/min   GFR calc Af Amer 82 (L) >90 mL/min    Comment: (NOTE) The eGFR has been calculated using the CKD EPI equation. This calculation has not been validated in all clinical situations. eGFR's persistently <90 mL/min signify possible Chronic Kidney Disease.    Anion gap 10 5 - 15    Dg Neck Soft Tissue  06/26/2014   CLINICAL DATA:  Acute onset shortness of breath earlier today. Sensation of foreign body in the throat.  EXAM: NECK SOFT TISSUES - 1+ VIEW  COMPARISON:  None.  FINDINGS: Examination was performed portably. No visible opaque foreign body in the soft tissues of the neck. Normal epiglottis. Normal phayrngeal airway. Multilevel degenerative disc disease and spondylosis involving the cervical spine, greatest at C5-6.  IMPRESSION: 1. No visible opaque foreign bodies in the soft tissues. 2. 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   Electronically Signed   By: Evangeline Dakin M.D.   On: 06/26/2014 08:34   Dg Chest Port 1 View  06/26/2014   CLINICAL DATA:  49 year old male with shortness of breath and sore throat. Initial encounter.  EXAM: PORTABLE CHEST - 1 VIEW  COMPARISON:  None.  FINDINGS: Mild pulmonary vascular congestion. Left base septal lines may indicate changes of early congestive heart failure.  Cardiomegaly.  Tortuous aorta.  Limited evaluation of mediastinal and hilar structures secondary to technique  and tortuous aorta. Patient would benefit from two view chest with better inspiration and cardiac leads removed.  No gross pneumothorax.  IMPRESSION: Mild pulmonary vascular congestion. Left base septal lines may indicate changes of early congestive heart failure.  Cardiomegaly.  Tortuous aorta.  Limited evaluation of mediastinal and hilar structures secondary to technique and tortuous aorta. Patient would benefit from two view chest with better inspiration and cardiac leads removed.   Electronically Signed   By: Chauncey Cruel M.D.   On: 06/26/2014 08:10    Review of Systems  Unable to perform ROS: intubated   Blood pressure 143/73, pulse 96, resp. rate 18, SpO2 100 %. Physical Exam  Constitutional: He appears well-developed and well-nourished. No distress.  Intubated and sedated.  HENT:  Head: Normocephalic and atraumatic.  Right Ear: External ear normal.  Left Ear: External ear normal.  Nose: Nose normal.  Mouth/Throat: Oropharynx is clear and moist.  Orotracheal intubation limits exam.  Eyes:  Eyes closed.  Neck: Neck supple.  Cardiovascular: Normal rate.   Respiratory: Effort normal.  Mechanically ventilated.  Neurological:  Sedated.  Skin: Skin is warm and dry.  Psychiatric:  Sedated.    Assessment/Plan: Stridor, respiratory failure, apparent acute supraglottitis By anesthesiologist description, it seems the patient's symptoms are due to acute supraglottitis.  Intubation successful.  He will need to have a few days of IV antibiotics prior to considering extubation, possibly in OR with direct laryngoscopy.  Will follow.  [name] [name] 06/26/2014, 9:05 AM

## 2014-06-27 ENCOUNTER — Encounter (HOSPITAL_COMMUNITY): Payer: Self-pay

## 2014-06-27 LAB — BASIC METABOLIC PANEL
Anion gap: 8 (ref 5–15)
BUN: 30 mg/dL — ABNORMAL HIGH (ref 6–23)
CHLORIDE: 107 meq/L (ref 96–112)
CO2: 20 mmol/L (ref 19–32)
Calcium: 8.5 mg/dL (ref 8.4–10.5)
Creatinine, Ser: 2.85 mg/dL — ABNORMAL HIGH (ref 0.50–1.35)
GFR calc Af Amer: 28 mL/min — ABNORMAL LOW (ref >90)
GFR, EST NON AFRICAN AMERICAN: 24 mL/min — AB (ref >90)
Glucose, Bld: 145 mg/dL — ABNORMAL HIGH (ref 70–99)
Potassium: 4.2 mmol/L (ref 3.5–5.1)
Sodium: 135 mmol/L (ref 135–145)

## 2014-06-27 LAB — GLUCOSE, CAPILLARY
GLUCOSE-CAPILLARY: 107 mg/dL — AB (ref 70–99)
GLUCOSE-CAPILLARY: 121 mg/dL — AB (ref 70–99)
GLUCOSE-CAPILLARY: 145 mg/dL — AB (ref 70–99)
Glucose-Capillary: 140 mg/dL — ABNORMAL HIGH (ref 70–99)
Glucose-Capillary: 127 mg/dL — ABNORMAL HIGH (ref 70–99)
Glucose-Capillary: 124 mg/dL — ABNORMAL HIGH (ref 70–99)

## 2014-06-27 LAB — CBC
HCT: 37.5 % — ABNORMAL LOW (ref 39.0–52.0)
Hemoglobin: 12.7 g/dL — ABNORMAL LOW (ref 13.0–17.0)
MCH: 27.4 pg (ref 26.0–34.0)
MCHC: 33.9 g/dL (ref 30.0–36.0)
MCV: 81 fL (ref 78.0–100.0)
PLATELETS: 134 10*3/uL — AB (ref 150–400)
RBC: 4.63 MIL/uL (ref 4.22–5.81)
RDW: 16 % — AB (ref 11.5–15.5)
WBC: 17 10*3/uL — AB (ref 4.0–10.5)

## 2014-06-27 LAB — BLOOD GAS, ARTERIAL
ACID-BASE DEFICIT: 4 mmol/L — AB (ref 0.0–2.0)
BICARBONATE: 20 meq/L (ref 20.0–24.0)
DRAWN BY: 31101
FIO2: 0.4 %
MECHVT: 620 mL
O2 Saturation: 97.8 %
PATIENT TEMPERATURE: 98.6
PEEP: 5 cmH2O
PO2 ART: 99.7 mmHg (ref 80.0–100.0)
RATE: 16 resp/min
TCO2: 21 mmol/L (ref 0–100)
pCO2 arterial: 32.7 mmHg — ABNORMAL LOW (ref 35.0–45.0)
pH, Arterial: 7.403 (ref 7.350–7.450)

## 2014-06-27 LAB — PROCALCITONIN: Procalcitonin: 12.31 ng/mL

## 2014-06-27 LAB — STREP PNEUMONIAE URINARY ANTIGEN: STREP PNEUMO URINARY ANTIGEN: NEGATIVE

## 2014-06-27 MED ORDER — VITAL AF 1.2 CAL PO LIQD
1000.0000 mL | ORAL | Status: DC
  Administered 2014-06-27 – 2014-06-29 (×3): 1000 mL
  Filled 2014-06-27 (×7): qty 1000

## 2014-06-27 MED ORDER — PIPERACILLIN-TAZOBACTAM 3.375 G IVPB
3.3750 g | Freq: Three times a day (TID) | INTRAVENOUS | Status: AC
  Administered 2014-06-27 – 2014-06-30 (×12): 3.375 g via INTRAVENOUS
  Filled 2014-06-27 (×13): qty 50

## 2014-06-27 MED ORDER — CHLORHEXIDINE GLUCONATE 0.12 % MT SOLN
15.0000 mL | Freq: Two times a day (BID) | OROMUCOSAL | Status: DC
  Administered 2014-06-28 – 2014-06-30 (×5): 15 mL via OROMUCOSAL
  Filled 2014-06-27 (×5): qty 15

## 2014-06-27 MED ORDER — VITAL HIGH PROTEIN PO LIQD
1000.0000 mL | ORAL | Status: DC
  Filled 2014-06-27 (×2): qty 1000

## 2014-06-27 MED ORDER — CETYLPYRIDINIUM CHLORIDE 0.05 % MT LIQD
7.0000 mL | Freq: Four times a day (QID) | OROMUCOSAL | Status: DC
  Administered 2014-06-28 – 2014-06-30 (×11): 7 mL via OROMUCOSAL

## 2014-06-27 MED ORDER — VANCOMYCIN HCL 10 G IV SOLR: 1250.0000 mg | INTRAVENOUS | Status: DC

## 2014-06-27 MED ORDER — DEXAMETHASONE SODIUM PHOSPHATE 10 MG/ML IJ SOLN
6.0000 mg | Freq: Two times a day (BID) | INTRAMUSCULAR | Status: DC
  Administered 2014-06-27 – 2014-06-30 (×7): 6 mg via INTRAVENOUS
  Filled 2014-06-27: qty 0.6
  Filled 2014-06-27: qty 1.5
  Filled 2014-06-27 (×4): qty 0.6
  Filled 2014-06-27: qty 1.5
  Filled 2014-06-27 (×2): qty 0.6

## 2014-06-27 MED ORDER — CLINDAMYCIN PHOSPHATE 600 MG/50ML IV SOLN
600.0000 mg | Freq: Three times a day (TID) | INTRAVENOUS | Status: DC
  Administered 2014-06-27 – 2014-06-28 (×3): 600 mg via INTRAVENOUS
  Filled 2014-06-27 (×5): qty 50

## 2014-06-27 MED ORDER — PRO-STAT SUGAR FREE PO LIQD
30.0000 mL | Freq: Two times a day (BID) | ORAL | Status: DC
Start: 2014-06-27 — End: 2014-06-28
  Administered 2014-06-28: 30 mL
  Filled 2014-06-27 (×2): qty 30

## 2014-06-27 MED ORDER — CLINDAMYCIN PHOSPHATE 600 MG/50ML IV SOLN
600.0000 mg | Freq: Three times a day (TID) | INTRAVENOUS | Status: DC
  Filled 2014-06-27 (×2): qty 50

## 2014-06-27 MED ORDER — SODIUM CHLORIDE 0.9 % IV SOLN
INTRAVENOUS | Status: DC
  Administered 2014-06-27 – 2014-06-28 (×3): via INTRAVENOUS

## 2014-06-27 NOTE — Plan of Care (Signed)
Problem: Phase I Progression Outcomes Goal: VTE prophylaxis Outcome: Completed/Met Date Met:  06/27/14 Heparin and SCDs

## 2014-06-27 NOTE — Progress Notes (Signed)
PULMONARY / CRITICAL CARE MEDICINE   Name: [name] [name] MRN: [id number] DOB: October 03, 1965    ADMISSION DATE:  06/26/2014   REFERRING MD :  EDP  CHIEF COMPLAINT:  Cant breath  INITIAL PRESENTATION: Stridor, sorethroat  STUDIES:  1/7 ct neck>> Mod narrowing of airway due to symmetric prominence of posterior lateral soft tissues and palantine tonsillar tissues. No tonsillar abscess.   SIGNIFICANT EVENTS: 1/7 intubated by anesthesia for stridor 1/7 ENT consult Dr. Jenne Pane  SUBJECTIVE:  Alert, awake, follows commands Denies neck pain or discomfort Breathing comfortably on vent  VITAL SIGNS: Temp:  [96.4 F (35.8 C)-100 F (37.8 C)] 96.8 F (36 C) (01/08 0742) Pulse Rate:  [44-114] 47 (01/08 0742) Resp:  [13-30] 16 (01/08 0742) BP: (72-223)/(34-173) 112/61 mmHg (01/08 0742) SpO2:  [89 %-100 %] 100 % (01/08 0742) FiO2 (%):  [40 %-100 %] 40 % (01/08 0742) Weight:  [86.8 kg (191 lb 5.8 oz)-95 kg (209 lb 7 oz)] 86.8 kg (191 lb 5.8 oz) (01/08 0442) HEMODYNAMICS:   VENTILATOR SETTINGS: Vent Mode:  [-] PSV;CPAP FiO2 (%):  [40 %-100 %] 40 % Set Rate:  [14 bmp-16 bmp] 16 bmp Vt Set:  [550 mL-620 mL] 620 mL PEEP:  [5 cmH20] 5 cmH20 Pressure Support:  [5 cmH20] 5 cmH20 Plateau Pressure:  [23 cmH20-36 cmH20] 27 cmH20 INTAKE / OUTPUT:  Intake/Output Summary (Last 24 hours) at 06/27/14 0749 Last data filed at 06/27/14 0700  Gross per 24 hour  Intake 3107.32 ml  Output    975 ml  Net 2132.32 ml    PHYSICAL EXAMINATION: General: awake, alert, follows commands Neuro: Follows commands, no focal deficits HEENT: NCAT, EOMI Cardiovascular: RRR, normal S1/S2, no m/g/r Lungs: CTA bilaterally, cuff leak noted Abdomen: BS+, soft, non-tender Musculoskeletal: no edema Skin: warm and dry  LABS:  CBC  Recent Labs Lab 06/26/14 0800 06/27/14 0321  WBC 19.2* 17.0*  HGB 14.8 12.7*  HCT 44.1 37.5*  PLT 150 134*   Coag's  Recent Labs Lab 06/26/14 1029  APTT 26  INR 1.23    BMET  Recent Labs Lab 06/26/14 0800 06/27/14 0321  NA 138 135  K 3.7 4.2  CL 102 107  CO2 26 20  BUN 9 30*  CREATININE 1.18 2.85*  GLUCOSE 167* 145*   Electrolytes  Recent Labs Lab 06/26/14 0800 06/26/14 1029 06/27/14 0321  CALCIUM 9.4  --  8.5  MG  --  1.9  --   PHOS  --  3.9  --    Sepsis Markers  Recent Labs Lab 06/26/14 0930 06/26/14 1029 06/26/14 1213 06/26/14 1230 06/27/14 0321  LATICACIDVEN 3.0*  --   --  2.3*  --   PROCALCITON  --  1.38 6.99  --  12.31   ABG  Recent Labs Lab 06/26/14 0945 06/26/14 1547 06/27/14 0243  PHART 7.339* 7.365 7.403  PCO2ART 43.8 34.2* 32.7*  PO2ART 226.0* 86.0 99.7   Liver Enzymes  Recent Labs Lab 06/26/14 0800  AST 46*  ALT 54*  ALKPHOS 81  BILITOT 1.2  ALBUMIN 4.2   Cardiac Enzymes  Recent Labs Lab 06/26/14 0930  TROPONINI 0.34*   Glucose  Recent Labs Lab 06/26/14 1205 06/26/14 1603 06/26/14 1944 06/27/14 0001 06/27/14 0400  GLUCAP 165* 137* 134* 145* 140*    Imaging Dg Neck Soft Tissue  06/26/2014   CLINICAL DATA:  Acute onset shortness of breath earlier today. Sensation of foreign body in the throat.  EXAM: NECK SOFT TISSUES - 1+ VIEW  COMPARISON:  None.  FINDINGS: Examination was performed portably. No visible opaque foreign body in the soft tissues of the neck. Normal epiglottis. Normal phayrngeal airway. Multilevel degenerative disc disease and spondylosis involving the cervical spine, greatest at C5-6.  IMPRESSION: 1. No visible opaque foreign bodies in the soft tissues. 2. 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   Electronically Signed   By: Hulan Saas M.D.   On: 06/26/2014 08:34   Ct Soft Tissue Neck W Contrast  06/26/2014   CLINICAL DATA:  Stridor with sore throat.  EXAM: CT NECK WITH CONTRAST  TECHNIQUE: Multidetector CT imaging of the neck was performed using the standard protocol following the bolus administration of intravenous  contrast.  CONTRAST:  75mL OMNIPAQUE IOHEXOL 300 MG/ML  SOLN  COMPARISON:  Soft tissue neck 06/26/2014 and chest x-ray same date  FINDINGS: The endotracheal and enteric tubes are present. The endotracheal tube is in adequate position. Visualized orbits are normal and symmetric.  Pharynx and larynx: There is moderate narrowing of the nasopharyngeal airway with mild symmetric soft tissue prominence of the palatine tonsillar tissue in posterior lateral tissues obscuring the origin of the eustachian tube and fossa of Rosenmuller bilaterally. There are retained secretions in the midline nasopharyngeal region adjacent the airway. Retained secretions over the right piriform sinus. Epiglottis is not well-defined and partially obscured by the endotracheal tube. Prevertebral soft tissues/ premucosal space unremarkable. No definite evidence of tonsillar abscess. Minimal air within the subglottic airway other than the endotracheal tube down the level of the vocal cords.  Salivary glands: Within normal.  Thyroid: Within normal.  Lymph nodes: Within normal.  Vascular: Within normal.  Limited intracranial: Within normal.  Mastoids and visualized paranasal sinuses: Subtle chronic inflammatory change over the maxillary and ethmoid sinus.  Skeleton: Minimal spondylosis of the cervical sparse.  Upper chest: Possible subtle hazy suprahilar opacification which may be due to edema or infection.  IMPRESSION: Moderate narrowing of the nasopharyngeal airway due to adjacent symmetric prominence of the posterior lateral soft tissues and palatine tonsillar tissues along with retained secretions. No focal tonsillar abscess. Narrowing of the subglottic airway around endotracheal tube as the epiglottis is not well-defined. No adenopathy or focal mass identified. It would be difficult to exclude an infectious or inflammatory process involving the tonsillar tissues or epiglottis.  Mild hazy central suprahilar airspace density which may be due to  edema.  Endotracheal tube and enteric tube as described.   Electronically Signed   By: Elberta Fortis M.D.   On: 06/26/2014 17:48   Dg Chest Port 1 View  06/26/2014   CLINICAL DATA:  Status post intubation.  EXAM: PORTABLE CHEST - 1 VIEW  COMPARISON:  Single view of the chest 06/26/2014 at 7:51 a.m.  FINDINGS: A new endotracheal tube is in place with the tip in good position at the level of the clavicular heads. NG tube is identified with the tip in the fundus of the stomach. There is mild basilar atelectasis. Cardiomegaly and vascular congestion appear unchanged. No pneumothorax or pleural effusion.  IMPRESSION: ET tube and NG tube are in good position.  Cardiomegaly and mild vascular congestion.   Electronically Signed   By: Drusilla Kanner M.D.   On: 06/26/2014 09:37   Dg Chest Port 1 View  06/26/2014   CLINICAL DATA:  49 year old male with shortness of breath and sore throat. Initial encounter.  EXAM: PORTABLE CHEST - 1 VIEW  COMPARISON:  None.  FINDINGS: Mild pulmonary vascular congestion. Left base  septal lines may indicate changes of early congestive heart failure.  Cardiomegaly.  Tortuous aorta.  Limited evaluation of mediastinal and hilar structures secondary to technique and tortuous aorta. Patient would benefit from two view chest with better inspiration and cardiac leads removed.  No gross pneumothorax.  IMPRESSION: Mild pulmonary vascular congestion. Left base septal lines may indicate changes of early congestive heart failure.  Cardiomegaly.  Tortuous aorta.  Limited evaluation of mediastinal and hilar structures secondary to technique and tortuous aorta. Patient would benefit from two view chest with better inspiration and cardiac leads removed.   Electronically Signed   By: Bridgett LarssonSteve  Olson M.D.   On: 06/26/2014 08:10     ASSESSMENT / PLAN:  PULMONARY OETT 1/7 for stridor>> A: Stridor from presumed epiglottis Acute resp failure P:   Vent bundle Sedation ENT following Solu-medrol 80 mg  IV Q6H, would favor decadron 6 mg q12h for now Vanc and Zosyn Extubate in OR with ENT back up Wean this am cpap5 ps5, goal 30 min , appears well and leaks, will d/w ENT timing of extubation in OR  CARDIOVASCULAR CVL none A:  No acute issues P:  Tele Allow pos balance  RENAL A:   AKI, ATN, r/o hypovolemia contribution, r/o contrast induced nephropathy starting? P:   Cr increasing 1.18>2.85 Increase IVF to 150 I&Os Clinically dry or euvolemic No lasix Change foley to condom Follow trend after volume, may need US  GASTROINTESTINAL A:   GI protection P:   PPUI Start feeds if not extubated  HEMATOLOGIC A:   DVt prevention P:  Sub q hep  INFECTIOUS A:  Presumed epiglottis PCT rise (in setting ARf, crt rise) P:   BCx2 1/7>> UC 1/7 >> Sputum 1/7 Vanc 1/7>> Zoysn 1/7>> clinda 1/8>>>  Strep pneumo neg Legionella pending  PCT 7>12, r we missing organism, source control seems absent ono CT  Consider addition clinda for group A strep ( has renal failure as well)  ENDOCRINE A:   No acute issue P:   Monitor glucose  NEUROLOGIC A:  Intact prior to intubation P:   RASS goal:-1 while intubated Diprivan/fentanyl UDS neg  FAMILY  - Updates: No family available  - Inter-disciplinary family meet or Palliative Care meeting due by:  day 7    TODAY'S SUMMARY:  49 yo aam from [place] who [facility]/26/14 with 2 days of sore throat, stridor and hypoxia. Anesthesia was called to place #7.0 OTT urgently and ENT(Dr. Jenne PaneBates) was consulted. Empirical abx with vanc/zoysn were  instituted along with solumedrol. Cords were reported to be edematous and he will need ICU/Vent/abx/sedation for several days and most likely will need extubation in OR with ENT present.    Rich Numberarly Rivet, MD  STAFF NOTE: I, Rory Percyaniel Klever [place], MD FACP have personally reviewed patient's available data, including medical history, events of note, physical examination and test results  as part of my evaluation. I have discussed with resident/NP and other care providers such as pharmacist, RN and RRT. In addition, I personally evaluated patient and elicited key findings of: continued pct rise, add clinda, for ENT to decide on extubation , has leak, weans well, if not extubated start TF, CTA alert oriented , calm, WUA, renal failure, volume, re assess crt The patient is critically ill with multiple organ systems failure and requires high complexity decision making for assessment and support, frequent evaluation and titration of therapies, application of advanced monitoring technologies and extensive interpretation of multiple databases.  Critical Care Time devoted to patient care services described in this note is 30 Minutes. This time reflects time of care of this signee: Rory Percy, MD FACP. This critical care time does not reflect procedure time, or teaching time or supervisory time of PA/NP/Med student/Med Resident etc but could involve care discussion time. Rest per NP/medical resident whose note is outlined above and that I agree with   Mcarthur Rossetti. Tyson Alias, MD, FACP Pgr: [phone] [place] 06/27/2014 10:10 AM

## 2014-06-27 NOTE — Progress Notes (Signed)
INITIAL NUTRITION ASSESSMENT  DOCUMENTATION CODES Per approved criteria  -Not Applicable   INTERVENTION:  Initiate TF via OGT with Vital AF 1.2 at 25 ml/h and Prostat 30 ml BID on day 1; on day 2, d/c Prostat and increase to goal rate of 65 ml/h (1560 ml per day) to provide 1872 kcals, 117 gm protein, 1265 ml free water daily.  Above TF regimen plus current propofol will provide a total of 1949 kcals per day.  NUTRITION DIAGNOSIS: Inadequate oral intake related to inability to eat as evidenced by NPO status.   Goal: Intake to meet >90% of estimated nutrition needs.  Monitor:  TF tolerance/adequacy, weight trend, labs, vent status.  Reason for Assessment: MD Consult for TF initiation and management.  49 y.o. male  Admitting Dx: Stridor, sore throat  ASSESSMENT: Admitted on 1/7, intubated for stridor. To be extubated in the OR by ENT. Received MD Consult for TF initiation and management.  Patient is currently intubated on ventilator support MV: 10.3 L/min Temp (24hrs), Avg:97.2 F (36.2 C), Min:96.4 F (35.8 C), Max:98.5 F (36.9 C)  Propofol: 2.9 ml/hr providing 77 kcals/day.  Height: Ht Readings from Last 1 Encounters:  06/26/14 5\' 8"  (1.727 m)    Weight: Wt Readings from Last 1 Encounters:  06/27/14 191 lb 5.8 oz (86.8 kg)    Ideal Body Weight: 70 kg  % Ideal Body Weight: 124%  Wt Readings from Last 10 Encounters:  06/27/14 191 lb 5.8 oz (86.8 kg)    Usual Body Weight: unknown  % Usual Body Weight: N/A  BMI:  Body mass index is 29.1 kg/(m^2).  Estimated Nutritional Needs: Kcal: 1907 Protein: 90-105 gm Fluid: 2 L  Skin: WDL  Diet Order: Diet NPO time specified  EDUCATION NEEDS: -Education not appropriate at this time   Intake/Output Summary (Last 24 hours) at 06/27/14 1331 Last data filed at 06/27/14 1200  Gross per 24 hour  Intake 2390.45 ml  Output    565 ml  Net 1825.45 ml    Last BM: PTA   Labs:   Recent Labs Lab  06/26/14 0800 06/26/14 1029 06/27/14 0321  NA 138  --  135  K 3.7  --  4.2  CL 102  --  107  CO2 26  --  20  BUN 9  --  30*  CREATININE 1.18  --  2.85*  CALCIUM 9.4  --  8.5  MG  --  1.9  --   PHOS  --  3.9  --   GLUCOSE 167*  --  145*    CBG (last 3)   Recent Labs  06/27/14 0400 06/27/14 0812 06/27/14 1158  GLUCAP 140* 121* 127*    Scheduled Meds: . antiseptic oral rinse  7 mL Mouth Rinse q12n4p  . chlorhexidine  15 mL Mouth Rinse BID  . clindamycin (CLEOCIN) IV  600 mg Intravenous 3 times per day  . dexamethasone  6 mg Intravenous BID  . famotidine (PEPCID) IV  20 mg Intravenous Q12H  . feeding supplement (VITAL HIGH PROTEIN)  1,000 mL Per Tube Q24H  . heparin  5,000 Units Subcutaneous 3 times per day  . piperacillin-tazobactam (ZOSYN)  IV  3.375 g Intravenous Q8H    Continuous Infusions: . sodium chloride 150 mL/hr at 06/27/14 0931  . DOPamine Stopped (06/26/14 1800)  . fentaNYL infusion INTRAVENOUS 25 mcg/hr (06/26/14 2200)  . propofol 5 mcg/kg/min (06/27/14 0122)    History reviewed. No pertinent past medical history.  History reviewed. No  pertinent past surgical history.   Joaquin CourtsKimberly Allen Egerton, RD, LDN, CNSC Pager [phone] After Hours Pager [phone]

## 2014-06-27 NOTE — Plan of Care (Signed)
Problem: Phase I Progression Outcomes Goal: GIProphysixis Outcome: Completed/Met Date Met:  06/27/14 Famotidine IV

## 2014-06-27 NOTE — Progress Notes (Signed)
Subjective: Remains intubated but awake and not requiring much sedation.  Objective: Vital signs in last 24 hours: Temp:  [96.4 F (35.8 C)-98.5 F (36.9 C)] 97.3 F (36.3 C) (01/08 1000) Pulse Rate:  [44-88] 68 (01/08 1800) Resp:  [11-19] 15 (01/08 1800) BP: (102-141)/(52-92) 133/56 mmHg (01/08 1800) SpO2:  [97 %-100 %] 100 % (01/08 1800) FiO2 (%):  [40 %] 40 % (01/08 1532) Weight:  [86.8 kg (191 lb 5.8 oz)] 86.8 kg (191 lb 5.8 oz) (01/08 0442) Last BM Date:  (pta)  Intake/Output from previous day: 01/07 0701 - 01/08 0700 In: 3207.3 [I.V.:2607.3; IV Piggyback:600] Out: 975 [Urine:920; Emesis/NG output:55] Intake/Output this shift: Total I/O In: 1498.6 [I.V.:1498.6] Out: 150 [Urine:150]  General appearance: alert, cooperative, no distress and awake and comfortable Throat: endotracheal intubation  Lab Results:   Recent Labs  06/26/14 0800 06/27/14 0321  WBC 19.2* 17.0*  HGB 14.8 12.7*  HCT 44.1 37.5*  PLT 150 134*   BMET  Recent Labs  06/26/14 0800 06/27/14 0321  NA 138 135  K 3.7 4.2  CL 102 107  CO2 26 20  GLUCOSE 167* 145*  BUN 9 30*  CREATININE 1.18 2.85*  CALCIUM 9.4 8.5   PT/INR  Recent Labs  06/26/14 1029  LABPROT 15.7*  INR 1.23   ABG  Recent Labs  06/26/14 1547 06/27/14 0243  PHART 7.365 7.403  HCO3 19.6* 20.0    Studies/Results: Dg Neck Soft Tissue  06/26/2014   CLINICAL DATA:  Acute onset shortness of breath earlier today. Sensation of foreign body in the throat.  EXAM: NECK SOFT TISSUES - 1+ VIEW  COMPARISON:  None.  FINDINGS: Examination was performed portably. No visible opaque foreign body in the soft tissues of the neck. Normal epiglottis. Normal phayrngeal airway. Multilevel degenerative disc disease and spondylosis involving the cervical spine, greatest at C5-6.  IMPRESSION: 1. No visible opaque foreign bodies in the soft tissues. 2. 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    Electronically Signed   By: Hulan Saas M.D.   On: 06/26/2014 08:34   Ct Soft Tissue Neck W Contrast  06/26/2014   CLINICAL DATA:  Stridor with sore throat.  EXAM: CT NECK WITH CONTRAST  TECHNIQUE: Multidetector CT imaging of the neck was performed using the standard protocol following the bolus administration of intravenous contrast.  CONTRAST:  75mL OMNIPAQUE IOHEXOL 300 MG/ML  SOLN  COMPARISON:  Soft tissue neck 06/26/2014 and chest x-ray same date  FINDINGS: The endotracheal and enteric tubes are present. The endotracheal tube is in adequate position. Visualized orbits are normal and symmetric.  Pharynx and larynx: There is moderate narrowing of the nasopharyngeal airway with mild symmetric soft tissue prominence of the palatine tonsillar tissue in posterior lateral tissues obscuring the origin of the eustachian tube and fossa of Rosenmuller bilaterally. There are retained secretions in the midline nasopharyngeal region adjacent the airway. Retained secretions over the right piriform sinus. Epiglottis is not well-defined and partially obscured by the endotracheal tube. Prevertebral soft tissues/ premucosal space unremarkable. No definite evidence of tonsillar abscess. Minimal air within the subglottic airway other than the endotracheal tube down the level of the vocal cords.  Salivary glands: Within normal.  Thyroid: Within normal.  Lymph nodes: Within normal.  Vascular: Within normal.  Limited intracranial: Within normal.  Mastoids and visualized paranasal sinuses: Subtle chronic inflammatory change over the maxillary and ethmoid sinus.  Skeleton: Minimal spondylosis of the cervical sparse.  Upper chest: Possible subtle  hazy suprahilar opacification which may be due to edema or infection.  IMPRESSION: Moderate narrowing of the nasopharyngeal airway due to adjacent symmetric prominence of the posterior lateral soft tissues and palatine tonsillar tissues along with retained secretions. No focal tonsillar  abscess. Narrowing of the subglottic airway around endotracheal tube as the epiglottis is not well-defined. No adenopathy or focal mass identified. It would be difficult to exclude an infectious or inflammatory process involving the tonsillar tissues or epiglottis.  Mild hazy central suprahilar airspace density which may be due to edema.  Endotracheal tube and enteric tube as described.   Electronically Signed   By: Elberta Fortis M.D.   On: 06/26/2014 17:48   Dg Chest Port 1 View  06/26/2014   CLINICAL DATA:  Status post intubation.  EXAM: PORTABLE CHEST - 1 VIEW  COMPARISON:  Single view of the chest 06/26/2014 at 7:51 a.m.  FINDINGS: A new endotracheal tube is in place with the tip in good position at the level of the clavicular heads. NG tube is identified with the tip in the fundus of the stomach. There is mild basilar atelectasis. Cardiomegaly and vascular congestion appear unchanged. No pneumothorax or pleural effusion.  IMPRESSION: ET tube and NG tube are in good position.  Cardiomegaly and mild vascular congestion.   Electronically Signed   By: Drusilla Kanner M.D.   On: 06/26/2014 09:37   Dg Chest Port 1 View  06/26/2014   CLINICAL DATA:  49 year old male with shortness of breath and sore throat. Initial encounter.  EXAM: PORTABLE CHEST - 1 VIEW  COMPARISON:  None.  FINDINGS: Mild pulmonary vascular congestion. Left base septal lines may indicate changes of early congestive heart failure.  Cardiomegaly.  Tortuous aorta.  Limited evaluation of mediastinal and hilar structures secondary to technique and tortuous aorta. Patient would benefit from two view chest with better inspiration and cardiac leads removed.  No gross pneumothorax.  IMPRESSION: Mild pulmonary vascular congestion. Left base septal lines may indicate changes of early congestive heart failure.  Cardiomegaly.  Tortuous aorta.  Limited evaluation of mediastinal and hilar structures secondary to technique and tortuous aorta. Patient would  benefit from two view chest with better inspiration and cardiac leads removed.   Electronically Signed   By: Bridgett Larsson M.D.   On: 06/26/2014 08:10    Anti-infectives: Anti-infectives    Start     Dose/Rate Route Frequency Ordered Stop   06/28/14 0800  vancomycin (VANCOCIN) 1,250 mg in sodium chloride 0.9 % 250 mL IVPB  Status:  Discontinued     1,250 mg166.7 mL/hr over 90 Minutes Intravenous Every 24 hours 06/27/14 0754 06/27/14 0930   06/27/14 1400  clindamycin (CLEOCIN) IVPB 600 mg  Status:  Discontinued     600 mg100 mL/hr over 30 Minutes Intravenous 3 times per day 06/27/14 1025 06/27/14 1027   06/27/14 1200  clindamycin (CLEOCIN) IVPB 600 mg     600 mg100 mL/hr over 30 Minutes Intravenous 3 times per day 06/27/14 1024     06/27/14 0200  piperacillin-tazobactam (ZOSYN) IVPB 3.375 g     3.375 g12.5 mL/hr over 240 Minutes Intravenous Every 8 hours 06/27/14 0107     06/26/14 1900  vancomycin (VANCOCIN) 1,250 mg in sodium chloride 0.9 % 250 mL IVPB  Status:  Discontinued     1,250 mg166.7 mL/hr over 90 Minutes Intravenous Every 12 hours 06/26/14 1025 06/27/14 0754   06/26/14 1700  piperacillin-tazobactam (ZOSYN) IVPB 3.375 g  Status:  Discontinued  3.375 g12.5 mL/hr over 240 Minutes Intravenous Every 8 hours 06/26/14 1025 06/26/14 2027   06/26/14 0900  vancomycin (VANCOCIN) 1,250 mg in sodium chloride 0.9 % 250 mL IVPB     1,250 mg166.7 mL/hr over 90 Minutes Intravenous STAT 06/26/14 0837 06/26/14 1031   06/26/14 0845  piperacillin-tazobactam (ZOSYN) IVPB 3.375 g     3.375 g100 mL/hr over 30 Minutes Intravenous STAT 06/26/14 0837 06/26/14 1116      Assessment/Plan: Acute suprglottits Remains intubated but awake.  I discussed his situation with Dr. Tyson AliasFeinstein.  It sounds like he has a good air leak around the tube when cuff deflated.  Still, I recommended careful management of his airway.  We agreed to leave him intubated through the weekend with broad spectrum antibiotics continuing.   He is scheduled for Monday for direct laryngoscopy for evaluation and possible extubation versus tracheostomy.  I discussed this with the patient and he expressed understanding.  I will discuss this with his family when available.  LOS: 1 day    Issak Goley 06/27/2014

## 2014-06-28 DIAGNOSIS — N179 Acute kidney failure, unspecified: Secondary | ICD-10-CM

## 2014-06-28 LAB — CBC
HCT: 34.4 % — ABNORMAL LOW (ref 39.0–52.0)
HEMOGLOBIN: 11.7 g/dL — AB (ref 13.0–17.0)
MCH: 28 pg (ref 26.0–34.0)
MCHC: 34 g/dL (ref 30.0–36.0)
MCV: 82.3 fL (ref 78.0–100.0)
Platelets: 131 10*3/uL — ABNORMAL LOW (ref 150–400)
RBC: 4.18 MIL/uL — ABNORMAL LOW (ref 4.22–5.81)
RDW: 16.6 % — ABNORMAL HIGH (ref 11.5–15.5)
WBC: 13.8 10*3/uL — ABNORMAL HIGH (ref 4.0–10.5)

## 2014-06-28 LAB — PROCALCITONIN: PROCALCITONIN: 7.71 ng/mL

## 2014-06-28 LAB — BASIC METABOLIC PANEL
Anion gap: 8 (ref 5–15)
BUN: 55 mg/dL — ABNORMAL HIGH (ref 6–23)
CALCIUM: 7.6 mg/dL — AB (ref 8.4–10.5)
CO2: 18 mmol/L — ABNORMAL LOW (ref 19–32)
Chloride: 109 mEq/L (ref 96–112)
Creatinine, Ser: 3.79 mg/dL — ABNORMAL HIGH (ref 0.50–1.35)
GFR calc non Af Amer: 17 mL/min — ABNORMAL LOW (ref >90)
GFR, EST AFRICAN AMERICAN: 20 mL/min — AB (ref >90)
GLUCOSE: 151 mg/dL — AB (ref 70–99)
Potassium: 4.2 mmol/L (ref 3.5–5.1)
SODIUM: 135 mmol/L (ref 135–145)

## 2014-06-28 LAB — GLUCOSE, CAPILLARY
GLUCOSE-CAPILLARY: 133 mg/dL — AB (ref 70–99)
Glucose-Capillary: 112 mg/dL — ABNORMAL HIGH (ref 70–99)
Glucose-Capillary: 117 mg/dL — ABNORMAL HIGH (ref 70–99)
Glucose-Capillary: 127 mg/dL — ABNORMAL HIGH (ref 70–99)
Glucose-Capillary: 130 mg/dL — ABNORMAL HIGH (ref 70–99)
Glucose-Capillary: 142 mg/dL — ABNORMAL HIGH (ref 70–99)

## 2014-06-28 LAB — URINE CULTURE
Colony Count: NO GROWTH
Culture: NO GROWTH
Special Requests: NORMAL

## 2014-06-28 LAB — VANCOMYCIN, RANDOM: VANCOMYCIN RM: 32.2 ug/mL

## 2014-06-28 MED ORDER — BISACODYL 5 MG PO TBEC: 10.0000 mg | DELAYED_RELEASE_TABLET | Freq: Every day | ORAL | Status: DC | PRN

## 2014-06-28 MED ORDER — POLYETHYLENE GLYCOL 3350 17 G PO PACK
17.0000 g | PACK | Freq: Every day | ORAL | Status: DC
  Filled 2014-06-28 (×3): qty 1

## 2014-06-28 MED ORDER — FENTANYL CITRATE 0.05 MG/ML IJ SOLN
25.0000 ug | INTRAMUSCULAR | Status: DC | PRN
  Administered 2014-06-28 – 2014-06-29 (×2): 50 ug via INTRAVENOUS
  Administered 2014-06-30: 100 ug via INTRAVENOUS
  Filled 2014-06-28 (×4): qty 2

## 2014-06-28 MED ORDER — ENOXAPARIN SODIUM 40 MG/0.4ML ~~LOC~~ SOLN
40.0000 mg | SUBCUTANEOUS | Status: DC
  Administered 2014-06-28: 40 mg via SUBCUTANEOUS
  Filled 2014-06-28 (×2): qty 0.4

## 2014-06-28 MED ORDER — FAMOTIDINE IN NACL 20-0.9 MG/50ML-% IV SOLN
20.0000 mg | INTRAVENOUS | Status: DC
  Administered 2014-06-29: 20 mg via INTRAVENOUS
  Filled 2014-06-28 (×2): qty 50

## 2014-06-28 MED ORDER — BISACODYL 10 MG RE SUPP: 10.0000 mg | Freq: Every day | RECTAL | Status: DC | PRN

## 2014-06-28 MED ORDER — FREE WATER
200.0000 mL | Freq: Three times a day (TID) | Status: DC
  Administered 2014-06-28 – 2014-06-29 (×5): 200 mL

## 2014-06-28 NOTE — Progress Notes (Signed)
PULMONARY / CRITICAL CARE MEDICINE   Name: [name] [name] MRN: [id number] DOB: 11/22/1965    ADMISSION DATE:  06/26/2014   REFERRING MD :  EDP  CHIEF COMPLAINT:  Cant breath  INITIAL PRESENTATION:  Intubated 01/07 for UAO due to supraglottitis  STUDIES:  1/7 CT NECK:  Mod narrowing of airway due to symmetric prominence of posterior lateral soft tissues and palantine tonsillar tissues. No tonsillar abscess.   SIGNIFICANT EVENTS: 1/7 intubated by anesthesia for stridor 1/7 ENT consult Dr. Jenne PaneBates  SUBJECTIVE:  Alert, awake, follows commands. No new complaints  VITAL SIGNS: Temp:  [97.6 F (36.4 C)-99.4 F (37.4 C)] 98.2 F (36.8 C) (01/09 0803) Pulse Rate:  [45-94] 72 (01/09 1000) Resp:  [11-30] 13 (01/09 1000) BP: (97-156)/(52-90) 151/80 mmHg (01/09 1000) SpO2:  [95 %-100 %] 100 % (01/09 1000) FiO2 (%):  [40 %] 40 % (01/09 1000) Weight:  [89.7 kg (197 lb 12 oz)] 89.7 kg (197 lb 12 oz) (01/09 0500) HEMODYNAMICS:   VENTILATOR SETTINGS: Vent Mode:  [-] PSV;CPAP FiO2 (%):  [40 %] 40 % Set Rate:  [16 bmp] 16 bmp Vt Set:  [620 mL] 620 mL PEEP:  [5 cmH20] 5 cmH20 Pressure Support:  [5 cmH20-8 cmH20] 5 cmH20 Plateau Pressure:  [25 cmH20-28 cmH20] 28 cmH20 INTAKE / OUTPUT:  Intake/Output Summary (Last 24 hours) at 06/28/14 1125 Last data filed at 06/28/14 1011  Gross per 24 hour  Intake 4481.85 ml  Output   1315 ml  Net 3166.85 ml    PHYSICAL EXAMINATION: General: RASS 0, + F/C Neuro: no focal deficits HEENT: NCAT, EOMI Cardiovascular: RRR, no M Lungs: Clear Abdomen: BS+, soft, non-tender Ext: no edema Skin: warm and dry  LABS: I have reviewed all of today's lab results. Relevant abnormalities are discussed in the A/P section  No new CXR  ASSESSMENT / PLAN:  PULMONARY A: UAO Supraglottitis P:   PSV as tolerated 24 hrs/da Re-examine in OR 01/11 per ENT  CARDIOVASCULAR A:  No acute issues P:  Monitor  RENAL A:   AKI, suspect ATN P:   Monitor  BMET intermittently Monitor I/Os Correct electrolytes as indicated Dose adjust all meds DC maintenance fluids Add free water  GASTROINTESTINAL A:   No issues P:   SUP: enteral famotidine Cont TFs  HEMATOLOGIC A:   No issues P:  DVT px: LMWH Monitor CBC intermittently Transfuse per usual ICU guidelines   INFECTIOUS A:  Bacterial supraglottitis P:   Strep pneumo neg BCx2 1/7>> UC 1/7 >> Vanc 1/7>> Zoysn 1/7>> clinda 1/8>> 01/09    ENDOCRINE A:   Mild steroid associated hyperglycemia P:   Monitor glucose  NEUROLOGIC A:  No issues P:   RASS goal: 0 PRN fentanyl  Billy Fischeravid Simonds, MD ; [facility] service Mobile [phone].  After 5:30 PM or weekends, call [phone]

## 2014-06-28 NOTE — Progress Notes (Signed)
Pt requested security go to his car and obtain his cell phone, which was completed. RN found patient's wallet in belonging bag- patient's wallet (all cash/cards listed), a gold necklace, and his car keys were taken to security in the ED and locked in the safe. Pt aware and able to sign documents. Copy of paperwork in pt's chart.

## 2014-06-28 NOTE — Progress Notes (Signed)
ANTIBIOTIC CONSULT NOTE - FOLLOW UP  Pharmacy Consult for vancomycin , Zosyn Indication: epiglottis  No Known Allergies Patient Measurements: Height:  (172.7 cm) Weight: 197 lb 12 oz (89.7 kg) IBW/kg (Calculated) : 68.4 Vital Signs: Temp: 98.2 F (36.8 C) (01/09 0803) Temp Source: Oral (01/09 0803) BP: 151/80 mmHg (01/09 1000) Pulse Rate: 72 (01/09 1000) Intake/Output from previous day: 01/08 0701 - 01/09 0700 In: 4591.5 [I.V.:3866.5; NG/GT:325; IV Piggyback:400] Out: 1025 [Urine:1025] Intake/Output from this shift: Total I/O In: 612 [I.V.:467; NG/GT:95; IV Piggyback:50] Out: 340 [Urine:340]  Labs:  Recent Labs  06/26/14 0800 06/27/14 0321 06/28/14 0600  WBC 19.2* 17.0* 13.8*  HGB 14.8 12.7* 11.7*  PLT 150 134* 131*  CREATININE 1.18 2.85* 3.79*   Estimated Creatinine Clearance: 25.4 mL/min (by C-G formula based on Cr of 3.79).  Recent Labs  06/28/14 0233  [facility] 32.2     Microbiology: Recent Results (from the past 720 hour(s))  Culture, blood (routine x 2)     Status: None (Preliminary result)   Collection Time: 06/26/14  9:20 AM  Result Value Ref Range Status   Specimen Description BLOOD LEFT ANTECUBITAL  Final   Special Requests BOTTLES DRAWN AEROBIC AND ANAEROBIC  Final   Culture   Final           BLOOD CULTURE RECEIVED NO GROWTH TO DATE CULTURE WILL BE HELD FOR 5 DAYS BEFORE ISSUING A FINAL NEGATIVE REPORT Performed at [facility]    Report Status PENDING  Incomplete  Culture, blood (routine x 2)     Status: None (Preliminary result)   Collection Time: 06/26/14  9:30 AM  Result Value Ref Range Status   Specimen Description BLOOD HAND LEFT  Final   Special Requests BOTTLES DRAWN AEROBIC AND ANAEROBIC  Final   Culture   Final           BLOOD CULTURE RECEIVED NO GROWTH TO DATE CULTURE WILL BE HELD FOR 5 DAYS BEFORE ISSUING A FINAL NEGATIVE REPORT Performed at [facility]    Report Status PENDING  Incomplete   MRSA PCR Screening     Status: None   Collection Time: 06/26/14 11:50 AM  Result Value Ref Range Status   MRSA by PCR NEGATIVE NEGATIVE Final    Comment:        The GeneXpert MRSA Assay (FDA approved for NASAL specimens only), is one component of a comprehensive MRSA colonization surveillance program. It is not intended to diagnose MRSA infection nor to guide or monitor treatment for MRSA infections.   Culture, respiratory (tracheal aspirate)     Status: None (Preliminary result)   Collection Time: 06/27/14  4:43 AM  Result Value Ref Range Status   Specimen Description TRACHEAL ASPIRATE  Final   Special Requests Normal  Final   Gram Stain   Final    RARE WBC PRESENT,BOTH PMN AND MONONUCLEAR NO SQUAMOUS EPITHELIAL CELLS SEEN NO ORGANISMS SEEN Performed at [facility]    Culture PENDING  Incomplete   Report Status PENDING  Incomplete    Anti-infectives    Start     Dose/Rate Route Frequency Ordered Stop   06/28/14 0800  vancomycin (VANCOCIN) 1,250 mg in sodium chloride 0.9 % 250 mL IVPB  Status:  Discontinued     1,250 mg166.7 mL/hr over 90 Minutes Intravenous Every 24 hours 06/27/14 0754 06/27/14 0930   06/27/14 1400  clindamycin (CLEOCIN) IVPB 600 mg  Status:  Discontinued     600 mg100  mL/hr over 30 Minutes Intravenous 3 times per day 06/27/14 1025 06/27/14 1027   06/27/14 1200  clindamycin (CLEOCIN) IVPB 600 mg     600 mg100 mL/hr over 30 Minutes Intravenous 3 times per day 06/27/14 1024     06/27/14 0200  piperacillin-tazobactam (ZOSYN) IVPB 3.375 g     3.375 g12.5 mL/hr over 240 Minutes Intravenous Every 8 hours 06/27/14 0107     06/26/14 1900  vancomycin (VANCOCIN) 1,250 mg in sodium chloride 0.9 % 250 mL IVPB  Status:  Discontinued     1,250 mg166.7 mL/hr over 90 Minutes Intravenous Every 12 hours 06/26/14 1025 06/27/14 0754   06/26/14 1700  piperacillin-tazobactam (ZOSYN) IVPB 3.375 g  Status:  Discontinued     3.375 g12.5 mL/hr over 240 Minutes  Intravenous Every 8 hours 06/26/14 1025 06/26/14 2027   06/26/14 0900  vancomycin (VANCOCIN) 1,250 mg in sodium chloride 0.9 % 250 mL IVPB     1,250 mg166.7 mL/hr over 90 Minutes Intravenous STAT 06/26/14 0837 06/26/14 1031   06/26/14 0845  piperacillin-tazobactam (ZOSYN) IVPB 3.375 g     3.375 g100 mL/hr over 30 Minutes Intravenous STAT 06/26/14 0837 06/26/14 1116      Assessment: 49 year old male on vancomycin, Zosyn, and clindamycin for epiglottis infection.   SCr increasing rapidly: 1.18 >>2.85 >>3.79.  Estimated CrCl~ 25 mL/min.   Vancomycin random this AM = 32.2 (24 hours after last dose).   UOP decreased (last recorded at 0.5 cc/kg/hr). Net +6L.   Goal of Therapy:  Vancomycin trough level 15-20 mcg/ml  Plan:  Continue to hold vancomycin. Repeat vancomycin level with AM labs (48 hours after last dose) to help determine clearance.  Continue Zosyn 3.375g IV q8h with low threshold to decrease if SCr continues to worsen.   Link SnufferJessica Keyon Winnick, PharmD, BCPS Clinical Pharmacist [phone] 06/28/2014,10:50 AM

## 2014-06-28 NOTE — Progress Notes (Signed)
Patient ID: [name] [name], male   DOB: 04/25/1966, 49 y.o.   MRN: [id number] Subjective: No complaints, he is quite comfortable, awake and alert.  Objective: Vital signs in last 24 hours: Temp:  [97.6 F (36.4 C)-99.4 F (37.4 C)] 98.2 F (36.8 C) (01/09 0803) Pulse Rate:  [45-94] 72 (01/09 1000) Resp:  [11-30] 13 (01/09 1000) BP: (97-156)/(52-90) 151/80 mmHg (01/09 1000) SpO2:  [95 %-100 %] 100 % (01/09 1000) FiO2 (%):  [40 %] 40 % (01/09 1000) Weight:  [89.7 kg (197 lb 12 oz)] 89.7 kg (197 lb 12 oz) (01/09 0500) Weight change: -5.3 kg (-11 lb 11 oz) Last BM Date:  (pta)  Intake/Output from previous day: 01/08 0701 - 01/09 0700 In: 4591.5 [I.V.:3866.5; NG/GT:325; IV Piggyback:400] Out: 1025 [Urine:1025] Intake/Output this shift: Total I/O In: 612 [I.V.:467; NG/GT:95; IV Piggyback:50] Out: 340 [Urine:340]  PHYSICAL EXAM: Orally intubated, awake and alert, watching TV. No complaints, no pain.  Lab Results:  Recent Labs  06/27/14 0321 06/28/14 0600  WBC 17.0* 13.8*  HGB 12.7* 11.7*  HCT 37.5* 34.4*  PLT 134* 131*   BMET  Recent Labs  06/27/14 0321 06/28/14 0600  NA 135 135  K 4.2 4.2  CL 107 109  CO2 20 18*  GLUCOSE 145* 151*  BUN 30* 55*  CREATININE 2.85* 3.79*  CALCIUM 8.5 7.6*    Studies/Results: Ct Soft Tissue Neck W Contrast  06/26/2014   CLINICAL DATA:  Stridor with sore throat.  EXAM: CT NECK WITH CONTRAST  TECHNIQUE: Multidetector CT imaging of the neck was performed using the standard protocol following the bolus administration of intravenous contrast.  CONTRAST:  75mL OMNIPAQUE IOHEXOL 300 MG/ML  SOLN  COMPARISON:  Soft tissue neck 06/26/2014 and chest x-ray same date  FINDINGS: The endotracheal and enteric tubes are present. The endotracheal tube is in adequate position. Visualized orbits are normal and symmetric.  Pharynx and larynx: There is moderate narrowing of the nasopharyngeal airway with mild symmetric soft tissue prominence of the palatine  tonsillar tissue in posterior lateral tissues obscuring the origin of the eustachian tube and fossa of Rosenmuller bilaterally. There are retained secretions in the midline nasopharyngeal region adjacent the airway. Retained secretions over the right piriform sinus. Epiglottis is not well-defined and partially obscured by the endotracheal tube. Prevertebral soft tissues/ premucosal space unremarkable. No definite evidence of tonsillar abscess. Minimal air within the subglottic airway other than the endotracheal tube down the level of the vocal cords.  Salivary glands: Within normal.  Thyroid: Within normal.  Lymph nodes: Within normal.  Vascular: Within normal.  Limited intracranial: Within normal.  Mastoids and visualized paranasal sinuses: Subtle chronic inflammatory change over the maxillary and ethmoid sinus.  Skeleton: Minimal spondylosis of the cervical sparse.  Upper chest: Possible subtle hazy suprahilar opacification which may be due to edema or infection.  IMPRESSION: Moderate narrowing of the nasopharyngeal airway due to adjacent symmetric prominence of the posterior lateral soft tissues and palatine tonsillar tissues along with retained secretions. No focal tonsillar abscess. Narrowing of the subglottic airway around endotracheal tube as the epiglottis is not well-defined. No adenopathy or focal mass identified. It would be difficult to exclude an infectious or inflammatory process involving the tonsillar tissues or epiglottis.  Mild hazy central suprahilar airspace density which may be due to edema.  Endotracheal tube and enteric tube as described.   Electronically Signed   By: Elberta Fortisaniel  Boyle M.D.   On: 06/26/2014 17:48    Medications: I have reviewed the patient's  current medications.  Assessment/Plan: Stable, intubated. Continue observation in ICU. Plan repeat exam in the operating room Monday.  LOS: 2 days   Kajsa Butrum 06/28/2014, 11:04 AM

## 2014-06-28 NOTE — Progress Notes (Signed)
Fentanyl gtt d/c'd- 50 ml wasted in medication room sink and disposed of bag and tubing disposed of appropriately, Ree Edmanrystal Manus, RN witness.

## 2014-06-29 LAB — BASIC METABOLIC PANEL
Anion gap: 9 (ref 5–15)
BUN: 66 mg/dL — ABNORMAL HIGH (ref 6–23)
CO2: 20 mmol/L (ref 19–32)
Calcium: 7.8 mg/dL — ABNORMAL LOW (ref 8.4–10.5)
Chloride: 113 mEq/L — ABNORMAL HIGH (ref 96–112)
Creatinine, Ser: 3.91 mg/dL — ABNORMAL HIGH (ref 0.50–1.35)
GFR calc Af Amer: 19 mL/min — ABNORMAL LOW (ref >90)
GFR, EST NON AFRICAN AMERICAN: 17 mL/min — AB (ref >90)
Glucose, Bld: 149 mg/dL — ABNORMAL HIGH (ref 70–99)
Potassium: 4 mmol/L (ref 3.5–5.1)
Sodium: 142 mmol/L (ref 135–145)

## 2014-06-29 LAB — CULTURE, RESPIRATORY W GRAM STAIN: Special Requests: NORMAL

## 2014-06-29 LAB — GLUCOSE, CAPILLARY
GLUCOSE-CAPILLARY: 141 mg/dL — AB (ref 70–99)
GLUCOSE-CAPILLARY: 134 mg/dL — AB (ref 70–99)
Glucose-Capillary: 116 mg/dL — ABNORMAL HIGH (ref 70–99)
Glucose-Capillary: 152 mg/dL — ABNORMAL HIGH (ref 70–99)

## 2014-06-29 LAB — CULTURE, RESPIRATORY

## 2014-06-29 LAB — VANCOMYCIN, RANDOM: Vancomycin Rm: 16.7 ug/mL

## 2014-06-29 MED ORDER — ENOXAPARIN SODIUM 30 MG/0.3ML ~~LOC~~ SOLN
30.0000 mg | SUBCUTANEOUS | Status: DC
  Administered 2014-06-29 – 2014-07-01 (×3): 30 mg via SUBCUTANEOUS
  Filled 2014-06-29 (×3): qty 0.3

## 2014-06-29 MED ORDER — FAMOTIDINE 40 MG/5ML PO SUSR
20.0000 mg | Freq: Every day | ORAL | Status: DC
Start: 2014-06-29 — End: 2014-06-30
  Administered 2014-06-30: 20 mg
  Filled 2014-06-29 (×2): qty 2.5

## 2014-06-29 MED ORDER — VANCOMYCIN HCL 10 G IV SOLR
1250.0000 mg | INTRAVENOUS | Status: DC
  Filled 2014-06-29: qty 1250

## 2014-06-29 MED ORDER — VANCOMYCIN HCL IN DEXTROSE 1-5 GM/200ML-% IV SOLN
1000.0000 mg | INTRAVENOUS | Status: DC
  Administered 2014-06-29 – 2014-06-30 (×2): 1000 mg via INTRAVENOUS
  Filled 2014-06-29 (×2): qty 200

## 2014-06-29 NOTE — Anesthesia Preprocedure Evaluation (Signed)
Anesthesia Evaluation  Patient identified by MRN, date of birth, ID band Patient awake    Reviewed: Allergy & Precautions, NPO status , Patient's Chart, lab work & pertinent test results, reviewed documented beta blocker date and time , Unable to perform ROS - Chart review only  Airway        Dental   Pulmonary  Acute epiglottitis, intubated 06/26/2014, Direct laryngoscopy and possible extubation in OR         Cardiovascular negative cardio ROS      Neuro/Psych negative neurological ROS  negative psych ROS   GI/Hepatic negative GI ROS, Neg liver ROS,   Endo/Other  negative endocrine ROS  Renal/GU ARFRenal 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     Musculoskeletal   Abdominal   Peds  Hematology   Anesthesia Other Findings   Reproductive/Obstetrics                             Anesthesia Physical Anesthesia Plan  ASA: II  Anesthesia Plan: General   Post-op Pain Management:    Induction: Intravenous  Airway Management Planned: Oral ETT  Additional Equipment:   Intra-op Plan:   Post-operative Plan: Extubation in OR  Informed Consent: I have reviewed the patients History and Physical, chart, labs and discussed the procedure including the risks, benefits and alternatives for the proposed anesthesia with the patient or authorized representative who has indicated his/her understanding and acceptance.     Plan Discussed with:   Anesthesia Plan Comments: (Pt. Intubated for acute epiglottitis 06/26/2014, acute Renal Insufficiency this visit, need to check am labs, for possible extubation in OR)        Anesthesia Quick Evaluation

## 2014-06-29 NOTE — Progress Notes (Signed)
PULMONARY / CRITICAL CARE MEDICINE   Name: [name] [name] MRN: [id number] DOB: 12/09/1965    ADMISSION DATE:  06/26/2014   REFERRING MD :  EDP  CHIEF COMPLAINT:  Cant breath  INITIAL PRESENTATION:  Intubated 01/07 for UAO due to supraglottitis  STUDIES:  1/7 CT NECK:  Mod narrowing of airway due to symmetric prominence of posterior lateral soft tissues and palantine tonsillar tissues. No tonsillar abscess.   SIGNIFICANT EVENTS: 1/7 intubated by anesthesia for stridor 1/7 ENT consult Dr. Jenne PaneBates  SUBJECTIVE:  Alert, awake, follows commands. On vent. No complaints.   VITAL SIGNS: Temp:  [98.2 F (36.8 C)-98.5 F (36.9 C)] 98.4 F (36.9 C) (01/10 0404) Pulse Rate:  [46-115] 58 (01/10 0600) Resp:  [12-25] 15 (01/10 0600) BP: (100-195)/(45-94) 126/65 mmHg (01/10 0600) SpO2:  [98 %-100 %] 99 % (01/10 0600) FiO2 (%):  [40 %] 40 % (01/10 0400) Weight:  [88.7 kg (195 lb 8.8 oz)] 88.7 kg (195 lb 8.8 oz) (01/10 0500) HEMODYNAMICS:   VENTILATOR SETTINGS: Vent Mode:  [-] PRVC FiO2 (%):  [40 %] 40 % Set Rate:  [16 bmp] 16 bmp Vt Set:  [620 mL] 620 mL PEEP:  [5 cmH20] 5 cmH20 Pressure Support:  [5 cmH20-8 cmH20] 8 cmH20 Plateau Pressure:  [22 cmH20-26 cmH20] 22 cmH20 INTAKE / OUTPUT:  Intake/Output Summary (Last 24 hours) at 06/29/14 0656 Last data filed at 06/29/14 0600  Gross per 24 hour  Intake 3269.65 ml  Output   1910 ml  Net 1359.65 ml    PHYSICAL EXAMINATION: General: RASS 0 Neuro: no focal deficits HEENT: NCAT, EOMI Cardiovascular: RRR, no m/g/r Lungs: Coarse breath sounds bilaterally Abdomen: BS+, soft, non-tender Ext: no edema Skin: warm and dry  LABS: I have reviewed all of today's lab results. Relevant abnormalities are discussed in the A/P section  No new CXR  ASSESSMENT / PLAN:  PULMONARY A: UAO Supraglottitis P:   PSV as tolerated 24 hrs/da Re-examine in OR today per ENT Continue Vanc and Zosyn Continue Decadron 6 mg IV  BID  CARDIOVASCULAR A:  No acute issues P:  Monitor  RENAL A:   AKI, suspect ATN P:   bmet tomorrow AM Monitor I/Os Correct electrolytes as indicated Dose adjust all meds Continue free water 200 ml Q8H  GASTROINTESTINAL A:   No issues P:   SUP: famotidine through tube Cont TFs  HEMATOLOGIC A:   No issues P:  DVT px: LMWH CBC, coags in AM Transfuse per usual ICU guidelines   INFECTIOUS A:  Bacterial supraglottitis P:   Strep pneumo neg BCx2 1/7>> UC 1/7 >> Resp Cx 1/8>> Vanc 1/7>> Zoysn 1/7>> clinda 1/8>> 01/09   ENDOCRINE A:   Mild steroid associated hyperglycemia P:   Monitor glucose  NEUROLOGIC A:  No issues P:   RASS goal: 0 PRN fentanyl    Rich Numberarly Rivet, MD Internal Medicine Resident, PGY-1   PCCM ATTENDING: I have reviewed pt's initial presentation, consultants notes and hospital database in detail.  The above assessment and plan was formulated under my direction.  Billy Fischeravid Tova Vater, MD;  PCCM service; Mobile [phone]

## 2014-06-29 NOTE — Progress Notes (Signed)
Patient ID: [name] [name], male   DOB: 07/14/1965, 49 y.o.   MRN: [id number] Subjective: No complaints.  Objective: Vital signs in last 24 hours: Temp:  [98.1 F (36.7 C)-98.7 F (37.1 C)] 98.1 F (36.7 C) (01/10 1233) Pulse Rate:  [50-115] 59 (01/10 1148) Resp:  [12-25] 20 (01/10 1100) BP: (105-195)/(45-94) 137/75 mmHg (01/10 1148) SpO2:  [98 %-100 %] 100 % (01/10 1100) FiO2 (%):  [40 %] 40 % (01/10 1148) Weight:  [88.7 kg (195 lb 8.8 oz)] 88.7 kg (195 lb 8.8 oz) (01/10 0500) Weight change: -1 kg (-2 lb 3.3 oz) Last BM Date: 06/29/14  Intake/Output from previous day: 01/09 0701 - 01/10 0700 In: 3159 [I.V.:1176.5; NG/GT:1820; IV Piggyback:162.5] Out: 1910 [Urine:1910] Intake/Output this shift: Total I/O In: 600 [I.V.:40; NG/GT:260; IV Piggyback:300] Out: 500 [Urine:500]  PHYSICAL EXAM: Sitting awake and alert. ETT in place, no problems breathing.  Lab Results:  Recent Labs  06/27/14 0321 06/28/14 0600  WBC 17.0* 13.8*  HGB 12.7* 11.7*  HCT 37.5* 34.4*  PLT 134* 131*   BMET  Recent Labs  06/28/14 0600 06/29/14 0240  NA 135 142  K 4.2 4.0  CL 109 113*  CO2 18* 20  GLUCOSE 151* 149*  BUN 55* 66*  CREATININE 3.79* 3.91*  CALCIUM 7.6* 7.8*    Studies/Results: No results found.  Medications: I have reviewed the patient's current medications.  Assessment/Plan: Stable. Plan DL, extubation in OR tomorrow.   LOS: 3 days   Athanasius Kesling 06/29/2014, 12:35 PM

## 2014-06-29 NOTE — Progress Notes (Signed)
ANTIBIOTIC CONSULT NOTE - FOLLOW UP  Pharmacy Consult for vancomycin  Indication: epiglottis  No Known Allergies Patient Measurements: Height:  (172.7 cm) Weight: 195 lb 8.8 oz (88.7 kg) IBW/kg (Calculated) : 68.4 Vital Signs: Temp: 98.7 F (37.1 C) (01/10 0833) Temp Source: Oral (01/10 0833) BP: 163/75 mmHg (01/10 0735) Pulse Rate: 85 (01/10 0735) Intake/Output from previous day: 01/09 0701 - 01/10 0700 In: 3159 [I.V.:1176.5; NG/GT:1820; IV Piggyback:162.5] Out: 1910 [Urine:1910] Intake/Output from this shift:    Labs:  Recent Labs  06/27/14 0321 06/28/14 0600 06/29/14 0240  WBC 17.0* 13.8*  --   HGB 12.7* 11.7*  --   PLT 134* 131*  --   CREATININE 2.85* 3.79* 3.91*   Estimated Creatinine Clearance: 24.5 mL/min (by C-G formula based on Cr of 3.91).  Recent Labs  06/28/14 0233 06/29/14 0240  VANCORANDOM 32.2 16.7     Microbiology: Recent Results (from the past 720 hour(s))  Culture, blood (routine x 2)     Status: None (Preliminary result)   Collection Time: 06/26/14  9:20 AM  Result Value Ref Range Status   Specimen Description BLOOD LEFT ANTECUBITAL  Final   Special Requests BOTTLES DRAWN AEROBIC AND ANAEROBIC  Final   Culture   Final           BLOOD CULTURE RECEIVED NO GROWTH TO DATE CULTURE WILL BE HELD FOR 5 DAYS BEFORE ISSUING A FINAL NEGATIVE REPORT Performed at [facility]    Report Status PENDING  Incomplete  Culture, blood (routine x 2)     Status: None (Preliminary result)   Collection Time: 06/26/14  9:30 AM  Result Value Ref Range Status   Specimen Description BLOOD HAND LEFT  Final   Special Requests BOTTLES DRAWN AEROBIC AND ANAEROBIC  Final   Culture   Final           BLOOD CULTURE RECEIVED NO GROWTH TO DATE CULTURE WILL BE HELD FOR 5 DAYS BEFORE ISSUING A FINAL NEGATIVE REPORT Performed at [facility]    Report Status PENDING  Incomplete  MRSA PCR Screening     Status: None   Collection Time: 06/26/14  11:50 AM  Result Value Ref Range Status   MRSA by PCR NEGATIVE NEGATIVE Final    Comment:        The GeneXpert MRSA Assay (FDA approved for NASAL specimens only), is one component of a comprehensive MRSA colonization surveillance program. It is not intended to diagnose MRSA infection nor to guide or monitor treatment for MRSA infections.   Urine culture     Status: None   Collection Time: 06/27/14  4:42 AM  Result Value Ref Range Status   Specimen Description URINE, CATHETERIZED  Final   Special Requests Normal  Final   Colony Count NO GROWTH Performed at [facility]   Final   Culture NO GROWTH Performed at [facility]   Final   Report Status 06/28/2014 FINAL  Final  Culture, respiratory (tracheal aspirate)     Status: None (Preliminary result)   Collection Time: 06/27/14  4:43 AM  Result Value Ref Range Status   Specimen Description TRACHEAL ASPIRATE  Final   Special Requests Normal  Final   Gram Stain   Final    RARE WBC PRESENT,BOTH PMN AND MONONUCLEAR NO SQUAMOUS EPITHELIAL CELLS SEEN NO ORGANISMS SEEN Performed at [facility]    Culture   Final    Culture reincubated for better growth Performed at [facility]    Report Status PENDING  Incomplete    Anti-infectives    Start     Dose/Rate Route Frequency Ordered Stop   06/29/14 0900  vancomycin (VANCOCIN) 1,250 mg in sodium chloride 0.9 % 250 mL IVPB     1,250 mg166.7 mL/hr over 90 Minutes Intravenous Every 48 hours 06/29/14 0847     06/28/14 0800  vancomycin (VANCOCIN) 1,250 mg in sodium chloride 0.9 % 250 mL IVPB  Status:  Discontinued     1,250 mg166.7 mL/hr over 90 Minutes Intravenous Every 24 hours 06/27/14 0754 06/27/14 0930   06/27/14 1400  clindamycin (CLEOCIN) IVPB 600 mg  Status:  Discontinued     600 mg100 mL/hr over 30 Minutes Intravenous 3 times per day 06/27/14 1025 06/27/14 1027   06/27/14 1200  clindamycin (CLEOCIN) IVPB 600 mg  Status:  Discontinued     600  mg100 mL/hr over 30 Minutes Intravenous 3 times per day 06/27/14 1024 06/28/14 1124   06/27/14 0200  piperacillin-tazobactam (ZOSYN) IVPB 3.375 g     3.375 g12.5 mL/hr over 240 Minutes Intravenous Every 8 hours 06/27/14 0107     06/26/14 1900  vancomycin (VANCOCIN) 1,250 mg in sodium chloride 0.9 % 250 mL IVPB  Status:  Discontinued     1,250 mg166.7 mL/hr over 90 Minutes Intravenous Every 12 hours 06/26/14 1025 06/27/14 0754   06/26/14 1700  piperacillin-tazobactam (ZOSYN) IVPB 3.375 g  Status:  Discontinued     3.375 g12.5 mL/hr over 240 Minutes Intravenous Every 8 hours 06/26/14 1025 06/26/14 2027   06/26/14 0900  vancomycin (VANCOCIN) 1,250 mg in sodium chloride 0.9 % 250 mL IVPB     1,250 mg166.7 mL/hr over 90 Minutes Intravenous STAT 06/26/14 0837 06/26/14 1031   06/26/14 0845  piperacillin-tazobactam (ZOSYN) IVPB 3.375 g     3.375 g100 mL/hr over 30 Minutes Intravenous STAT 06/26/14 0837 06/26/14 1116      Assessment: 49 year old male on vancomycin and Zosyn for epiglottis infection. Cultures remain no growth to date. No abscess noted.   SCr increasing rapidly: 1.18 >>2.85 >>3.79>>3.91.  Estimated CrCl~ 20-25 mL/min.   Vancomycin random 1/9  = 32.2 (20 hours after last dose of 1250mg  IV q12) Vancomycin random this AM = 16.7 (44 hours after last dose of 1250mg  IV q12).  Ke 0.024, new half-life ~25 hours  UOP up some in last 24 hours (last recorded at 0.9 cc/kg/hr).    Goal of Therapy:  Vancomycin trough level 15-20 mcg/ml  Plan:  Restart vancomycin 1 gram IV every 24 hours (decrease to 48 hours if SCr continues to rise) Continue Zosyn 3.375g IV q8h with low threshold to decrease if SCr continues to worsen.  Monitor renal function, clinical status, and culture results.   Link SnufferJessica Khristian Seals, PharmD, BCPS Clinical Pharmacist [phone] 06/29/2014,8:49 AM

## 2014-06-30 ENCOUNTER — Inpatient Hospital Stay (HOSPITAL_COMMUNITY): Payer: 59 | Admitting: Certified Registered Nurse Anesthetist

## 2014-06-30 ENCOUNTER — Encounter (HOSPITAL_COMMUNITY): Payer: Self-pay | Admitting: Certified Registered Nurse Anesthetist

## 2014-06-30 ENCOUNTER — Encounter (HOSPITAL_COMMUNITY)
Admission: EM | Disposition: A | Discharge status: Home / Self Care | Payer: Self-pay | Source: Home / Self Care | Attending: Internal Medicine

## 2014-06-30 HISTORY — PX: DIRECT LARYNGOSCOPY: SHX5326

## 2014-06-30 LAB — BASIC METABOLIC PANEL
Anion gap: 6 (ref 5–15)
BUN: 65 mg/dL — AB (ref 6–23)
CALCIUM: 7.7 mg/dL — AB (ref 8.4–10.5)
CO2: 24 mmol/L (ref 19–32)
CREATININE: 3.67 mg/dL — AB (ref 0.50–1.35)
Chloride: 112 mEq/L (ref 96–112)
GFR calc Af Amer: 21 mL/min — ABNORMAL LOW (ref >90)
GFR, EST NON AFRICAN AMERICAN: 18 mL/min — AB (ref >90)
Glucose, Bld: 130 mg/dL — ABNORMAL HIGH (ref 70–99)
POTASSIUM: 3.7 mmol/L (ref 3.5–5.1)
Sodium: 142 mmol/L (ref 135–145)

## 2014-06-30 LAB — CBC
HCT: 36.6 % — ABNORMAL LOW (ref 39.0–52.0)
HEMOGLOBIN: 12.3 g/dL — AB (ref 13.0–17.0)
MCH: 27.3 pg (ref 26.0–34.0)
MCHC: 33.6 g/dL (ref 30.0–36.0)
MCV: 81.2 fL (ref 78.0–100.0)
Platelets: 163 10*3/uL (ref 150–400)
RBC: 4.51 MIL/uL (ref 4.22–5.81)
RDW: 16.3 % — ABNORMAL HIGH (ref 11.5–15.5)
WBC: 9.7 10*3/uL (ref 4.0–10.5)

## 2014-06-30 LAB — CLOSTRIDIUM DIFFICILE BY PCR: Toxigenic C. Difficile by PCR: NEGATIVE

## 2014-06-30 LAB — PROTIME-INR
INR: 1.18 (ref 0.00–1.49)
Prothrombin Time: 15.1 seconds (ref 11.6–15.2)

## 2014-06-30 LAB — APTT: aPTT: 26 seconds (ref 24–37)

## 2014-06-30 SURGERY — LARYNGOSCOPY, DIRECT
Anesthesia: General | Site: Mouth

## 2014-06-30 MED ORDER — FENTANYL CITRATE 0.05 MG/ML IJ SOLN: 25.0000 ug | INTRAMUSCULAR | Status: DC | PRN

## 2014-06-30 MED ORDER — MEPERIDINE HCL 25 MG/ML IJ SOLN: 6.2500 mg | INTRAMUSCULAR | Status: DC | PRN

## 2014-06-30 MED ORDER — LIDOCAINE HCL (CARDIAC) 20 MG/ML IV SOLN
INTRAVENOUS | Status: DC | PRN
  Administered 2014-06-30: 100 mg via INTRAVENOUS

## 2014-06-30 MED ORDER — SUCCINYLCHOLINE CHLORIDE 20 MG/ML IJ SOLN
INTRAMUSCULAR | Status: DC | PRN
  Administered 2014-06-30: 60 mg via INTRAVENOUS

## 2014-06-30 MED ORDER — MIDAZOLAM HCL 5 MG/5ML IJ SOLN
INTRAMUSCULAR | Status: DC | PRN
  Administered 2014-06-30: 2 mg via INTRAVENOUS

## 2014-06-30 MED ORDER — AMOXICILLIN-POT CLAVULANATE 875-125 MG PO TABS
1.0000 | ORAL_TABLET | Freq: Two times a day (BID) | ORAL | Status: DC
  Administered 2014-07-01: 1 via ORAL
  Filled 2014-06-30 (×2): qty 1

## 2014-06-30 MED ORDER — POTASSIUM CHLORIDE IN NACL 20-0.45 MEQ/L-% IV SOLN
INTRAVENOUS | Status: DC
  Administered 2014-06-30: 16:00:00 via INTRAVENOUS
  Filled 2014-06-30 (×2): qty 1000

## 2014-06-30 MED ORDER — PROMETHAZINE HCL 25 MG/ML IJ SOLN: 6.2500 mg | INTRAMUSCULAR | Status: DC | PRN

## 2014-06-30 MED ORDER — PROPOFOL 10 MG/ML IV BOLUS
INTRAVENOUS | Status: DC | PRN
  Administered 2014-06-30: 100 mg via INTRAVENOUS

## 2014-06-30 MED ORDER — FENTANYL CITRATE 0.05 MG/ML IJ SOLN
INTRAMUSCULAR | Status: DC | PRN
  Administered 2014-06-30: 100 ug via INTRAVENOUS

## 2014-06-30 MED ORDER — MIDAZOLAM HCL 2 MG/2ML IJ SOLN
INTRAMUSCULAR | Status: AC
  Filled 2014-06-30: qty 2

## 2014-06-30 MED ORDER — FENTANYL CITRATE 0.05 MG/ML IJ SOLN
INTRAMUSCULAR | Status: AC
  Filled 2014-06-30: qty 5

## 2014-06-30 MED ORDER — LACTATED RINGERS IV SOLN
INTRAVENOUS | Status: DC | PRN
  Administered 2014-06-30: 11:00:00 via INTRAVENOUS

## 2014-06-30 SURGICAL SUPPLY — 49 items
BALLN PULM 15 16.5 18X75 (BALLOONS)
BALLOON PULM 15 16.5 18X75 (BALLOONS) IMPLANT
BLADE SURG ROTATE 9660 (MISCELLANEOUS) IMPLANT
CANISTER SUCTION 2500CC (MISCELLANEOUS) ×3 IMPLANT
CLEANER TIP ELECTROSURG 2X2 (MISCELLANEOUS) IMPLANT
CONT SPEC 4OZ CLIKSEAL STRL BL (MISCELLANEOUS) IMPLANT
COVER MAYO STAND STRL (DRAPES) IMPLANT
COVER SURGICAL LIGHT HANDLE (MISCELLANEOUS) IMPLANT
COVER TABLE BACK 60X90 (DRAPES) IMPLANT
CRADLE DONUT ADULT HEAD (MISCELLANEOUS) ×3 IMPLANT
DECANTER SPIKE VIAL GLASS SM (MISCELLANEOUS) IMPLANT
DRAPE PROXIMA HALF (DRAPES) IMPLANT
ELECT COATED BLADE 2.86 ST (ELECTRODE) IMPLANT
ELECT REM PT RETURN 9FT ADLT (ELECTROSURGICAL)
ELECTRODE REM PT RTRN 9FT ADLT (ELECTROSURGICAL) IMPLANT
GAUZE SPONGE 4X4 16PLY XRAY LF (GAUZE/BANDAGES/DRESSINGS) IMPLANT
GLOVE BIO SURGEON STRL SZ7.5 (GLOVE) ×3 IMPLANT
GLOVE SURG SS PI 6.5 STRL IVOR (GLOVE) ×6 IMPLANT
GOWN STRL REUS W/ TWL LRG LVL3 (GOWN DISPOSABLE) ×4 IMPLANT
GOWN STRL REUS W/TWL LRG LVL3 (GOWN DISPOSABLE) ×2
GUARD TEETH (MISCELLANEOUS) ×3 IMPLANT
HOLDER TRACH TUBE VELCRO 19.5 (MISCELLANEOUS) IMPLANT
KIT BASIN OR (CUSTOM PROCEDURE TRAY) ×3 IMPLANT
KIT ROOM TURNOVER OR (KITS) ×3 IMPLANT
KIT SUCTION CATH 14FR (SUCTIONS) IMPLANT
NEEDLE HYPO 25GX1X1/2 BEV (NEEDLE) IMPLANT
NEEDLE TRANS ORAL INJECTION (NEEDLE) IMPLANT
NS IRRIG 1000ML POUR BTL (IV SOLUTION) IMPLANT
PACK EENT II TURBAN DRAPE (CUSTOM PROCEDURE TRAY) ×3 IMPLANT
PAD ARMBOARD 7.5X6 YLW CONV (MISCELLANEOUS) ×6 IMPLANT
PATTIES SURGICAL .5 X3 (DISPOSABLE) IMPLANT
PENCIL BUTTON HOLSTER BLD 10FT (ELECTRODE) IMPLANT
SOLUTION ANTI FOG 6CC (MISCELLANEOUS) ×3 IMPLANT
SPONGE DRAIN TRACH 4X4 STRL 2S (GAUZE/BANDAGES/DRESSINGS) IMPLANT
SPONGE INTESTINAL PEANUT (DISPOSABLE) IMPLANT
SURGILUBE 2OZ TUBE FLIPTOP (MISCELLANEOUS) IMPLANT
SUT MON AB 3-0 SH 27 (SUTURE)
SUT MON AB 3-0 SH27 (SUTURE) IMPLANT
SUT SILK 0 FSL (SUTURE) IMPLANT
SUT SILK 2 0 REEL (SUTURE) IMPLANT
SUT SILK 2 0 SH CR/8 (SUTURE) IMPLANT
SUT VIC AB 2-0 FS1 27 (SUTURE) IMPLANT
SYR 20ML ECCENTRIC (SYRINGE) IMPLANT
SYR BULB 3OZ (MISCELLANEOUS) IMPLANT
SYR CONTROL 10ML LL (SYRINGE) IMPLANT
TOWEL OR 17X24 6PK STRL BLUE (TOWEL DISPOSABLE) ×3 IMPLANT
TOWEL OR 17X26 10 PK STRL BLUE (TOWEL DISPOSABLE) IMPLANT
TUBE CONNECTING 12X1/4 (SUCTIONS) ×3 IMPLANT
WATER STERILE IRR 1000ML POUR (IV SOLUTION) IMPLANT

## 2014-06-30 NOTE — Anesthesia Postprocedure Evaluation (Signed)
  Anesthesia Post-op Note  Patient: [name] [name]  Procedure(s) Performed: Procedure(s): DIRECT LARYNGOSCOPY (N/A)  Patient Location: PACU  Anesthesia Type:General  Level of Consciousness: awake and alert   Airway and Oxygen Therapy: Patient Spontanous Breathing and Patient connected to nasal cannula oxygen  Post-op Pain: none  Post-op Assessment: Post-op Vital signs reviewed  Post-op Vital Signs: Reviewed and stable  Last Vitals:  Filed Vitals:   06/30/14 0900  BP: 125/62  Pulse: 49  Temp:   Resp: 17    Complications: No apparent anesthesia complications

## 2014-06-30 NOTE — Progress Notes (Addendum)
PULMONARY / CRITICAL CARE MEDICINE   Name: [name] [name] MRN: [id number] DOB: [birth year]    ADMISSION DATE:  06/26/2014   REFERRING MD :  EDP  CHIEF COMPLAINT:  Cant breath  INITIAL PRESENTATION:  Intubated 01/07 for UAO due to supraglottitis  STUDIES:  1/7 CT NECK:  Mod narrowing of airway due to symmetric prominence of posterior lateral soft tissues and palantine tonsillar tissues. No tonsillar abscess.   SIGNIFICANT EVENTS: 1/7 intubated by anesthesia for stridor 1/7 ENT consult Dr. Jenne Pane  SUBJECTIVE:  Alert, awake, follows commands. On vent. No complaints.   VITAL SIGNS: Temp:  [97.9 F (36.6 C)-98.7 F (37.1 C)] 98.2 F (36.8 C) (01/11 0317) Pulse Rate:  [47-99] 94 (01/11 0600) Resp:  [15-25] 20 (01/11 0600) BP: (126-164)/(65-89) 153/79 mmHg (01/11 0600) SpO2:  [96 %-100 %] 100 % (01/11 0600) FiO2 (%):  [40 %] 40 % (01/11 0400) Weight:  [86.6 kg (190 lb 14.7 oz)] 86.6 kg (190 lb 14.7 oz) (01/11 0500) HEMODYNAMICS:   VENTILATOR SETTINGS: Vent Mode:  [-] PRVC FiO2 (%):  [40 %] 40 % Set Rate:  [16 bmp] 16 bmp Vt Set:  [620 mL] 620 mL PEEP:  [5 cmH20] 5 cmH20 Pressure Support:  [5 cmH20] 5 cmH20 Plateau Pressure:  [18 cmH20-22 cmH20] 18 cmH20 INTAKE / OUTPUT:  Intake/Output Summary (Last 24 hours) at 06/30/14 0645 Last data filed at 06/30/14 0600  Gross per 24 hour  Intake   1840 ml  Output   1975 ml  Net   -135 ml    PHYSICAL EXAMINATION: General: RASS 0 Neuro: no focal deficits HEENT: NCAT, EOMI Cardiovascular: RRR, no m/g/r Lungs: Coarse breath sounds bilaterally Abdomen: BS+, soft, non-tender Ext: no edema Skin: warm and dry  LABS: I have reviewed all of today's lab results. Relevant abnormalities are discussed in the A/P section  No new CXR  ASSESSMENT / PLAN:  PULMONARY A: UAO Supraglottitis P:   PSV as tolerated 24 hrs/da Re-examine in OR today per ENT Continue Vanc and Zosyn Continue Decadron 6 mg IV BID  CARDIOVASCULAR A:  No  acute issues P:  Monitor  RENAL A:   AKI, suspect ATN P:   Cr improving gradually Monitor I/Os Correct electrolytes as indicated Dose adjust all meds Continue free water 200 ml Q8H  GASTROINTESTINAL A:   No issues P:   SUP: famotidine through tube Cont TFs  HEMATOLOGIC A:   No issues P:  DVT px: LMWH Coags- INR 1.18, PT 15.1, aPTT 26 Transfuse per usual ICU guidelines   INFECTIOUS A:  Bacterial supraglottitis P:   Strep pneumo neg BCx2 1/7>> ng  UC 1/7 >> ng Resp Cx 1/8>> few beta hemolytic strep, not group A Vanc 1/7>> 1/11 Zoysn 1/7>> 1/11 clinda 1/8>> 01/09 Augmentin 1/12 >> (plan 7 days post discharge)   ENDOCRINE A:   Mild steroid associated hyperglycemia P:   Monitor glucose  NEUROLOGIC A:  No issues P:   RASS goal: 0 PRN fentanyl    Rich Number, MD Internal Medicine Resident, PGY-1   PCCM ATTENDING: I have reviewed pt's initial presentation, consultants notes and hospital database in detail.  The above assessment and plan was formulated under my direction.  In summary: Successfully extubated this AM. Comfortable post extubation. Will Vanc today in absence of evidence of a resistant gram pos infection Cont Zosyn through today and begin augmentin in AM 1/12 Begin diet Anticipate DC to home 1/12 as long as renal function continues to improve   Onalee Hua  Sung AmabileSimonds, MD;  PCCM service; Mobile [phone]

## 2014-06-30 NOTE — Discharge Summary (Signed)
Physician Discharge Summary  Patient ID: [name] [name] MRN: [id number] DOB/AGE: Feb 22, 1966 49 y.o.  Admit date: 06/26/2014 Discharge date: 07/01/2014    Discharge Diagnoses:  UAO Supraglottitis AKI, suspect ATN - improving                                                                     DISCHARGE PLAN BY DIAGNOSIS    UAO Supraglottitis Plan: Continue Augmentin through 01/18 for total of 7 days (through 07/07/14). Follow up in IM clinic on Friday, Jan 15th at 3:15 PM. He will need repeat bmet to assess kidney function.                  DISCHARGE SUMMARY   [name] [name] is a 49 y.o. y/o male with unknown PMH, presented to [facility] ED on morning of 06/26/14 with 2 day history of sore throat.  History was difficult to obtain as pt was having trouble speaking.  He reported that he began having difficulty breathing earlier that morning and also developed stridor.  While in ED, pt had progressive respiratory distress and required intubation.  Due to airway concerns, anesthesia was consulted to perform the intubation, and per their reports, epiglottis was quite inflamed and thickened and glottis was not visualized. Pt was admitted to the ICU and treated with steroids and antibiotics. On 01/11, he was taken to the OR for extubation which was performed successfully. On 01/12, he remained medically stable and was cleared for discharge. He will follow up in the IM clinic.             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 CT Neck 01/07 >>> moderate narrowing of airway due to symmetric prominence of posterior lateral soft tissues and palantine tonsillar tissues.  No tonsillar abscess.   SIGNIFICANT EVENTS 01/07 - intubated by anesthesia, admitted to ICU 01/07 - ENT consult  MICRO DATA  Blood 01/07 >>>  Sputum 01/08 >>> few beta hemolytic strep, not group  A. Urine 01/08 >>> neg C.diff 01/10 >>> neg  ANTIBIOTICS Vanc 01/07 >>> 01/11 Zosyn 01/07 >>> 01/12 Clinda 01/08 >>> 01/09 Augmentin 01/12 >>> (through 01/18 for total of 7 days)  CONSULTS ENT  TUBES / LINES OETT 01/07 >>> 01/11 OGT 01/07 >>> 01/11   Discharge Exam: General: alert, awake, resting in bed Neuro: alert and oriented x 3, no focal deficits HEENT: NCAT, EOMI, PERRL, pharynx non-erythematous, mucus membranes moist Cardiovascular: RRR, no m/g/r Lungs: CTA bilaterally, breaths non-labored Abdomen: BS+, soft, non-tender Ext: no edema Skin: warm and dry    Filed Vitals:   07/01/14 0600 07/01/14 0755 07/01/14 0800 07/01/14 0900  BP:   169/85 151/85  Pulse: 52  52 57  Temp:  98.1 F (36.7 C)    TempSrc:  Oral    Resp: _0 Height:      Weight:      SpO2: 100%  100% 99%     Discharge Labs  BMET  Recent Labs Lab 06/26/14 1029 06/27/14 0321 06/28/14 0600 06/29/14 0240 06/30/14 0245 07/01/14 0256  NA  --  135 135 142 142 144  K  --  4.2  4.2 4.0 3.7 4.1  CL  --  107 109 113* 112 112  CO2  --  20 18* _0 GLUCOSE  --  145* 151* 149* 130* 93  BUN  --  30* 55* 66* 65* 62*  CREATININE  --  2.85* 3.79* 3.91* 3.67* 3.57*  CALCIUM  --  8.5 7.6* 7.8* 7.7* 8.0*  MG 1.9  --   --   --   --   --   PHOS 3.9  --   --   --   --   --     CBC  Recent Labs Lab 06/27/14 0321 06/28/14 0600 06/30/14 0245  HGB 12.7* 11.7* 12.3*  HCT 37.5* 34.4* 36.6*  WBC 17.0* 13.8* 9.7  PLT 134* 131* 163    Anti-Coagulation  Recent Labs Lab 06/26/14 1029 06/30/14 0245  INR 1.23 1.18      Medication List    STOP taking these medications        ALEVE 220 MG tablet  Generic drug:  naproxen sodium     OVER THE COUNTER MEDICATION      TAKE these medications        amoxicillin-clavulanate 875-125 MG per tablet  Commonly known as:  AUGMENTIN  Take 1 tablet by mouth 2 (two) times daily.     multivitamin with minerals Tabs tablet  Take 1 tablet  by mouth daily.        Disposition: Stable and ready for discharge.   Discharged Condition: [name] [name] has met maximum benefit of inpatient care and is medically stable and cleared for discharge.  Patient is pending follow up as above.      Time spent on disposition:  Greater than 45 minutes.     Merton Border, MD ; [facility] [phone].  After 5:30 PM or weekends, call [phone]

## 2014-06-30 NOTE — Brief Op Note (Signed)
06/26/2014 - 06/30/2014  11:11 AM  PATIENT:  [name] [name]  49 y.o. male  PRE-OPERATIVE DIAGNOSIS:  respiratory failure, acute supraglottitis  POST-OPERATIVE DIAGNOSIS:  respiratory failure, acute supraglottitis  PROCEDURE:  Procedure(s): DIRECT LARYNGOSCOPY (N/A)  SURGEON:  Surgeon(s) and Role:    * Christia Readingwight Odas Ozer, MD - Primary  PHYSICIAN ASSISTANT:   ASSISTANTS: none   ANESTHESIA:   general  EBL:  Total I/O In: 210 [I.V.:10; IV Piggyback:200] Out: 250 [Urine:250]  BLOOD ADMINISTERED:none  DRAINS: none   LOCAL MEDICATIONS USED:  NONE  SPECIMEN:  No Specimen  DISPOSITION OF SPECIMEN:  N/A  COUNTS:  YES  TOURNIQUET:  * No tourniquets in log *  DICTATION: .Other Dictation: Dictation Number [id number]  PLAN OF CARE: Return to ICU  PATIENT DISPOSITION:  ICU - extubated and stable.   Delay start of Pharmacological VTE agent (>24hrs) due to surgical blood loss or risk of bleeding: no

## 2014-06-30 NOTE — Progress Notes (Signed)
UR Completed.  [phone]  

## 2014-06-30 NOTE — Progress Notes (Signed)
Patient taken to OR for extubation.  Returned extubated and on nasal cannula with no apparent complications.  RT will continue to monitor.

## 2014-06-30 NOTE — Transfer of Care (Signed)
Immediate Anesthesia Transfer of Care Note  Patient: [name] [name]  Procedure(s) Performed: Procedure(s): DIRECT LARYNGOSCOPY (N/A)  Patient Location: ICU  Anesthesia Type:General  Level of Consciousness: awake, alert  and oriented  Airway & Oxygen Therapy: Patient Spontanous Breathing and Patient connected to nasal cannula oxygen  Post-op Assessment: Report given to PACU RN and Post -op Vital signs reviewed and stable  Post vital signs: Reviewed and stable  Complications: No apparent anesthesia complications

## 2014-06-30 NOTE — Addendum Note (Signed)
Addendum  created 06/30/14 1322 by Minus LibertyBritney Lenor Provencher, CRNA   Modules edited: Charges VN

## 2014-07-01 ENCOUNTER — Encounter (HOSPITAL_COMMUNITY): Payer: Self-pay | Admitting: Otolaryngology

## 2014-07-01 LAB — BASIC METABOLIC PANEL
ANION GAP: 6 (ref 5–15)
BUN: 62 mg/dL — ABNORMAL HIGH (ref 6–23)
CALCIUM: 8 mg/dL — AB (ref 8.4–10.5)
CO2: 26 mmol/L (ref 19–32)
CREATININE: 3.57 mg/dL — AB (ref 0.50–1.35)
Chloride: 112 mEq/L (ref 96–112)
GFR calc Af Amer: 21 mL/min — ABNORMAL LOW (ref >90)
GFR calc non Af Amer: 18 mL/min — ABNORMAL LOW (ref >90)
Glucose, Bld: 93 mg/dL (ref 70–99)
Potassium: 4.1 mmol/L (ref 3.5–5.1)
Sodium: 144 mmol/L (ref 135–145)

## 2014-07-01 LAB — LEGIONELLA ANTIGEN, URINE

## 2014-07-01 MED ORDER — AMOXICILLIN-POT CLAVULANATE 875-125 MG PO TABS: 1.0000 | ORAL_TABLET | Freq: Two times a day (BID) | ORAL | Status: DC

## 2014-07-01 NOTE — Op Note (Signed)
NAMMadelon Lips:  Bhargava, Eyob                ACCOUNT NO.:  [id number]  MEDICAL RECORD NO.:  [id number]  LOCATION:  2M07C                        FACILITY:  [facility]  PHYSICIAN:  Antony Contraswight D Weaver Tweed, MD     DATE OF BIRTH:  1966-01-14  DATE OF PROCEDURE:  06/30/2014 DATE OF DISCHARGE:                              OPERATIVE REPORT   PREOPERATIVE DIAGNOSIS:  Acute supraglottitis.  POSTOPERATIVE DIAGNOSIS:  Acute supraglottitis.  PROCEDURE:  Direct laryngoscopy and extubation.  SURGEON:  Antony Contraswight D Laureen Frederic, MD  ANESTHESIA:  General endotracheal anesthesia.  COMPLICATIONS:  None.  INDICATION:  The patient is a 49 year old male who presented to the hospital 4 days ago with acute throat pain and difficulty breathing with stridor.  Intubation was performed in the emergency department and the epiglottis was found to be edematous and inflamed.  He has been treated with IV antibiotics since that time while remaining intubated and presents to the operating room today for direct laryngoscopy, possible extubation, possible tracheostomy.  FINDINGS:  Upon laryngoscopy, the epiglottis was found to have exudative changes on either side along the edge.  The remainder of the epiglottis was inflamed, but not particularly edematous.  The endolaryngeal surface was fairly normal appearing.  The glottis was normal.  There was a good air leak around the endotracheal tube, so the decision was made to extubate.  DESCRIPTION OF PROCEDURE:  The patient was identified in the intensive care unit and after discussion of risks, benefits, and alternatives, he was brought to the operating room and put on the operative table in a supine position.  Anesthesia was induced in endotracheal fashion.  The bed was turned 90 degrees from anesthesia and a tooth guard was placed over the upper teeth.  A Dedo laryngoscope was inserted into the oropharynx and placed into a supraglottic position and used to evaluate the epiglottis.  Findings  were noted above.  The evaluation included the lingual surface as well as the laryngeal surface.  The airway was suctioned.  The endotracheal tube cuff was deflated and a good air leak witnessed.  Laryngoscope was taken out from the patient's mouth as was the tooth guard.  He was then turned back to anesthesia and allowed to wake-up.  When he was fully awake, the endotracheal tube was removed, and the patient is a little brief easily. He was moved back to the intensive care unit in stable condition.     Antony Contraswight D Jamilah Jean, MD     DDB/MEDQ  D:  06/30/2014  T:  07/01/2014  Job:  [id number]

## 2014-07-01 NOTE — Progress Notes (Signed)
1 Day Post-Op  Subjective: He did quite well overnight after extubation.  Drinking water well.  Voice is fairly normal.  No breathing difficulty.  Objective: Vital signs in last 24 hours: Temp:  [97.9 F (36.6 C)-98.2 F (36.8 C)] 98.1 F (36.7 C) (01/12 0755) Pulse Rate:  [41-91] 52 (01/12 0600) Resp:  [14-29] 19 (01/12 0600) BP: (119-157)/(62-83) 157/83 mmHg (01/11 2025) SpO2:  [95 %-100 %] 100 % (01/12 0600) FiO2 (%):  [40 %] 40 % (01/11 0831) Weight:  [85.7 kg (188 lb 15 oz)] 85.7 kg (188 lb 15 oz) (01/12 0500) Last BM Date: 06/30/14  Intake/Output from previous day: 01/11 0701 - 01/12 0700 In: 1860 [P.O.:600; I.V.:960; IV Piggyback:300] Out: 1650 [Urine:1650] Intake/Output this shift:    General appearance: alert, cooperative, no distress and normal voice, no stridor  Lab Results:   Recent Labs  06/30/14 0245  WBC 9.7  HGB 12.3*  HCT 36.6*  PLT 163   BMET  Recent Labs  06/30/14 0245 07/01/14 0256  NA 142 144  K 3.7 4.1  CL 112 112  CO2 24 26  GLUCOSE 130* 93  BUN 65* 62*  CREATININE 3.67* 3.57*  CALCIUM 7.7* 8.0*   PT/INR  Recent Labs  06/30/14 0245  LABPROT 15.1  INR 1.18   ABG No results for input(s): PHART, HCO3 in the last 72 hours.  Invalid input(s): PCO2, PO2  Studies/Results: No results found.  Anti-infectives: Anti-infectives    Start     Dose/Rate Route Frequency Ordered Stop   07/01/14 1000  amoxicillin-clavulanate (AUGMENTIN) 875-125 MG per tablet 1 tablet     1 tablet Oral 2 times daily 06/30/14 1452     06/29/14 0915  vancomycin (VANCOCIN) IVPB 1000 mg/200 mL premix  Status:  Discontinued     1,000 mg200 mL/hr over 60 Minutes Intravenous Every 24 hours 06/29/14 0903 06/30/14 1452   06/29/14 0900  vancomycin (VANCOCIN) 1,250 mg in sodium chloride 0.9 % 250 mL IVPB  Status:  Discontinued     1,250 mg166.7 mL/hr over 90 Minutes Intravenous Every 48 hours 06/29/14 0847 06/29/14 0852   06/28/14 0800  vancomycin (VANCOCIN) 1,250  mg in sodium chloride 0.9 % 250 mL IVPB  Status:  Discontinued     1,250 mg166.7 mL/hr over 90 Minutes Intravenous Every 24 hours 06/27/14 0754 06/27/14 0930   06/27/14 1400  clindamycin (CLEOCIN) IVPB 600 mg  Status:  Discontinued     600 mg100 mL/hr over 30 Minutes Intravenous 3 times per day 06/27/14 1025 06/27/14 1027   06/27/14 1200  clindamycin (CLEOCIN) IVPB 600 mg  Status:  Discontinued     600 mg100 mL/hr over 30 Minutes Intravenous 3 times per day 06/27/14 1024 06/28/14 1124   06/27/14 0200  piperacillin-tazobactam (ZOSYN) IVPB 3.375 g     3.375 g12.5 mL/hr over 240 Minutes Intravenous Every 8 hours 06/27/14 0107 06/30/14 2108   06/26/14 1900  vancomycin (VANCOCIN) 1,250 mg in sodium chloride 0.9 % 250 mL IVPB  Status:  Discontinued     1,250 mg166.7 mL/hr over 90 Minutes Intravenous Every 12 hours 06/26/14 1025 06/27/14 0754   06/26/14 1700  piperacillin-tazobactam (ZOSYN) IVPB 3.375 g  Status:  Discontinued     3.375 g12.5 mL/hr over 240 Minutes Intravenous Every 8 hours 06/26/14 1025 06/26/14 2027   06/26/14 0900  vancomycin (VANCOCIN) 1,250 mg in sodium chloride 0.9 % 250 mL IVPB     1,250 mg166.7 mL/hr over 90 Minutes Intravenous STAT 06/26/14 0837 06/26/14 1031  06/26/14 0845  piperacillin-tazobactam (ZOSYN) IVPB 3.375 g     3.375 g100 mL/hr over 30 Minutes Intravenous STAT 06/26/14 0837 06/26/14 1116      Assessment/Plan: Acute supraglottitis s/p Procedure(s): DIRECT LARYNGOSCOPY (N/A) Doing great.  He can be discharged home on Augmentin.  Follow-up with me as needed.  LOS: 5 days    [name] [name] 07/01/2014

## 2014-07-01 NOTE — Progress Notes (Signed)
NUTRITION FOLLOW UP  Intervention:   None at this time.  Nutrition Dx:   No new nutrition diagnosis.  Goal:   Intake to meet >90% of estimated nutrition needs.  Monitor:   PO intake, labs, weight trend.  Assessment:   Admitted on 1/7, intubated for stridor. Extubated in the OR by ENT on 1/11. Received TF while intubated, now off. Diet has been advanced to regular. Being D/C'ed home today.  Height: Ht Readings from Last 1 Encounters:  06/26/14 5\' 8"  (1.727 m)    Weight Status:   Wt Readings from Last 1 Encounters:  07/01/14 188 lb 15 oz (85.7 kg)    Re-estimated needs:  Kcal: 2000-2200 Protein: 100-115 gm Fluid: 2-2.2 L  Skin: WDL  Diet Order: Diet regular   Intake/Output Summary (Last 24 hours) at 07/01/14 0939 Last data filed at 07/01/14 0600  Gross per 24 hour  Intake   1650 ml  Output   1400 ml  Net    250 ml    Last BM: 1/11   Labs:   Recent Labs Lab 06/26/14 1029  06/29/14 0240 06/30/14 0245 07/01/14 0256  NA  --   < > 142 142 144  K  --   < > 4.0 3.7 4.1  CL  --   < > 113* 112 112  CO2  --   < > 20 24 26   BUN  --   < > 66* 65* 62*  CREATININE  --   < > 3.91* 3.67* 3.57*  CALCIUM  --   < > 7.8* 7.7* 8.0*  MG 1.9  --   --   --   --   PHOS 3.9  --   --   --   --   GLUCOSE  --   < > 149* 130* 93  < > = values in this interval not displayed.  CBG (last 3)   Recent Labs  06/29/14 0403 06/29/14 0808 06/29/14 1231  GLUCAP 141* 116* 134*    Scheduled Meds: . amoxicillin-clavulanate  1 tablet Oral BID  . enoxaparin (LOVENOX) injection  30 mg Subcutaneous Q24H    Continuous Infusions: . 0.45 % NaCl with KCl 20 mEq / L 50 mL/hr at 06/30/14 1549    Joaquin CourtsKimberly Harris, RD, LDN, CNSC Pager [phone] After Hours Pager [phone]

## 2014-07-01 NOTE — Discharge Instructions (Signed)
It was a pleasure taking care of you, Mr. Montel ClockDadson.  Please make it to your follow appointment at the Internal medicine clinic on Friday, January 15th at 3:15 PM. It is important you make it to this appointment because we need to check labs for your kidney function.  Please do not take any ibuprofen, aleve, or advil.   Take care, Dr. Beckie Saltsivet

## 2014-07-01 NOTE — Care Management Note (Signed)
    Page 1 of 1   07/01/2014     2:12:53 PM CARE MANAGEMENT NOTE 07/01/2014  Patient:  [name],[name]   Account Number:  [id number]  Date Initiated:  06/26/2014  Documentation initiated by:  [facility] Inc[facility]OWN,Debanhi Blaker  Subjective/Objective Assessment:   Admitted with sore throat and swelling - stridor - ENT intubated     Action/Plan:   Anticipated DC Date:  06/30/2014   Anticipated DC Plan:  HOME/SELF CARE      DC Planning Services  CM consult      Choice offered to / List presented to:             Status of service:  Completed, signed off Medicare Important Message given?   (If response is "NO", the following Medicare IM given date fields will be blank) Date Medicare IM given:   Medicare IM given by:   Date Additional Medicare IM given:   Additional Medicare IM given by:    Discharge Disposition:  HOME/SELF CARE  Per UR Regulation:  Reviewed for med. necessity/level of care/duration of stay  If discussed at Long Length of Stay Meetings, dates discussed:   07/01/2014    Comments:  Contact:  Romelle Muldoon,Priness Friend [phone]  07-01-14 9:30am Avie ArenasSarah Charee Tumblin, RNBSN [phone]- [phone] Talked with patient about dc needs.  States he does have insurance - can get his meds - see the only med he will need is augmentin..  Admament that he does have insurance - could not tell me what - notified Admitting.  06-30-14 2:30pm Avie ArenasSarah Calandria Mullings, [phone]- [phone] To OR today and successfully extubated.

## 2014-07-01 NOTE — Progress Notes (Signed)
SLP Cancellation Note  Patient Details Name: Clydene Lamingsaac Reiley MRN: [id number] DOB: 09/28/1965   Cancelled treatment:   Per notes, discussion with RN and pt,  swallowing deficits are resolved;  therefore will not complete swallow evaluation.  Please contact SLP [phone]) should our services be warranted.         Thanks, Blenda MountsCouture, Timberlyn Pickford Laurice 07/01/2014, 9:12 AM

## 2014-07-01 NOTE — Progress Notes (Signed)
PULMONARY / CRITICAL CARE MEDICINE   Name: [name] [name] MRN: [id number] DOB: 02/08/1966    ADMISSION DATE:  06/26/2014   REFERRING MD :  EDP  CHIEF COMPLAINT:  Cant breath  INITIAL PRESENTATION:  Intubated 01/07 for UAO due to supraglottitis  STUDIES:  1/7 CT NECK:  Mod narrowing of airway due to symmetric prominence of posterior lateral soft tissues and palantine tonsillar tissues. No tonsillar abscess.   SIGNIFICANT EVENTS: 1/7 intubated by anesthesia for stridor 1/7 ENT consult Dr. Jenne PaneBates 1/11 Extubated in OR with ENT  SUBJECTIVE:  Extubated yesterday, doing well. Eating with no difficulties.   VITAL SIGNS: Temp:  [97.9 F (36.6 C)-98.4 F (36.9 C)] 98.2 F (36.8 C) (01/12 0348) Pulse Rate:  [41-91] 52 (01/12 0600) Resp:  [14-29] 19 (01/12 0600) BP: (119-157)/(62-83) 157/83 mmHg (01/11 2025) SpO2:  [95 %-100 %] 100 % (01/12 0600) FiO2 (%):  [40 %] 40 % (01/11 0831) Weight:  [85.7 kg (188 lb 15 oz)] 85.7 kg (188 lb 15 oz) (01/12 0500) HEMODYNAMICS:   VENTILATOR SETTINGS: Vent Mode:  [-] PSV;CPAP FiO2 (%):  [40 %] 40 % PEEP:  [5 cmH20] 5 cmH20 Pressure Support:  [5 cmH20] 5 cmH20 INTAKE / OUTPUT:  Intake/Output Summary (Last 24 hours) at 07/01/14 0708 Last data filed at 07/01/14 0600  Gross per 24 hour  Intake   1860 ml  Output   1650 ml  Net    210 ml    PHYSICAL EXAMINATION: General: alert, awake, resting in bed Neuro: alert and oriented x 3, no focal deficits HEENT: NCAT, EOMI, PERRL, pharynx non-erythematous, mucus membranes moist Cardiovascular: RRR, no m/g/r Lungs: CTA bilaterally, breaths non-labored Abdomen: BS+, soft, non-tender Ext: no edema Skin: warm and dry  LABS: I have reviewed all of today's lab results. Relevant abnormalities are discussed in the A/P section  No new CXR  ASSESSMENT / PLAN:  PULMONARY A: UAO Supraglottitis Extubated successfully 1/11 P:   Regular diet Augmentin started 1/11, continue as outpt for 7  days  CARDIOVASCULAR A:  No acute issues P:  Monitor  RENAL A:   AKI, suspect ATN P:   Cr improving  Monitor I/Os Correct electrolytes as indicated Dose adjust all meds  GASTROINTESTINAL A:   No issues P:   SUP: famotidine through tube Regular diet  HEMATOLOGIC A:   No issues P:  DVT px: Lovenox Transfuse per usual ICU guidelines  INFECTIOUS A:  Bacterial supraglottitis P:   Strep pneumo neg BCx2 1/7>> ng  UC 1/7 >> ng Resp Cx 1/8>> few beta hemolytic strep, not group A Vanc 1/7>> 1/11 Zoysn 1/7>> 1/11 clinda 1/8>> 01/09 Augmentin 1/12 >> (plan 7 days post discharge)  Will have patient follow up in Internal Medicine Clinic  ENDOCRINE A:   Mild steroid associated hyperglycemia P:   Monitor glucose  NEUROLOGIC A:  No issues P:   PRN fentanyl    Rich Numberarly Rivet, MD Internal Medicine Resident, PGY-1   Billy Fischeravid Selig [place], MD ; [facility] service Mobile [phone].  After 5:30 PM or weekends, call [phone]

## 2014-07-01 NOTE — Progress Notes (Signed)
Discharge instructions given. Walked patient on RA, upon return to his room O2 saturation 98%. Awaiting for Security to take him to his car.  Cory Roughen. Petrita Blunck, RN

## 2014-07-02 ENCOUNTER — Encounter (HOSPITAL_COMMUNITY): Payer: Self-pay | Admitting: Emergency Medicine

## 2014-07-02 LAB — CULTURE, BLOOD (ROUTINE X 2)
Culture: NO GROWTH
Culture: NO GROWTH

## 2014-07-04 ENCOUNTER — Ambulatory Visit: Payer: Self-pay | Admitting: Internal Medicine

## 2014-07-04 ENCOUNTER — Encounter: Payer: Self-pay | Admitting: Internal Medicine

## 2014-07-04 ENCOUNTER — Ambulatory Visit (INDEPENDENT_AMBULATORY_CARE_PROVIDER_SITE_OTHER): Payer: Managed Care, Other (non HMO) | Admitting: Internal Medicine

## 2014-07-04 ENCOUNTER — Encounter: Payer: Self-pay | Admitting: *Deleted

## 2014-07-04 VITALS — BP 170/93 | HR 69 | Temp 97.7°F | Wt 189.4 lb

## 2014-07-04 DIAGNOSIS — J0510 Acute epiglottitis without obstruction: Secondary | ICD-10-CM

## 2014-07-04 DIAGNOSIS — Z0000 Encounter for general adult medical examination without abnormal findings: Secondary | ICD-10-CM | POA: Insufficient documentation

## 2014-07-04 DIAGNOSIS — I10 Essential (primary) hypertension: Secondary | ICD-10-CM | POA: Diagnosis not present

## 2014-07-04 DIAGNOSIS — N179 Acute kidney failure, unspecified: Secondary | ICD-10-CM | POA: Diagnosis not present

## 2014-07-04 LAB — COMPLETE METABOLIC PANEL WITH GFR
ALK PHOS: 56 U/L (ref 39–117)
ALT: 97 U/L — AB (ref 0–53)
AST: 34 U/L (ref 0–37)
Albumin: 3.5 g/dL (ref 3.5–5.2)
BUN: 42 mg/dL — AB (ref 6–23)
CO2: 23 meq/L (ref 19–32)
CREATININE: 3.02 mg/dL — AB (ref 0.50–1.35)
Calcium: 8.7 mg/dL (ref 8.4–10.5)
Chloride: 101 mEq/L (ref 96–112)
GFR, Est African American: 27 mL/min — ABNORMAL LOW
GFR, Est Non African American: 23 mL/min — ABNORMAL LOW
GLUCOSE: 87 mg/dL (ref 70–99)
Potassium: 4 mEq/L (ref 3.5–5.3)
Sodium: 137 mEq/L (ref 135–145)
Total Bilirubin: 0.4 mg/dL (ref 0.2–1.2)
Total Protein: 7.1 g/dL (ref 6.0–8.3)

## 2014-07-04 LAB — CK: Total CK: 314 U/L — ABNORMAL HIGH (ref 7–232)

## 2014-07-04 MED ORDER — AMLODIPINE BESYLATE 5 MG PO TABS: 5.0000 mg | ORAL_TABLET | Freq: Every day | ORAL | Status: DC

## 2014-07-04 NOTE — Patient Instructions (Signed)
Thank you for your visit today.   Please return to the internal medicine clinic in about 2 weeks to recheck your blood pressure.  I have started you on a medication for blood pressure, amlodipine 5mg , please take this daily. We will check your kidney function today to make sure it is improving. Please avoid NSAID's including ibuprofen (advil), aleve (naproxen) as these may harm your kidneys.  It is also important to avoid hypertension. Please limit your use of alcohol to 2 drinks/day.  This may also raise your blood pressure if you drink more alcohol. Limit your salt intake as well to no more than 2000mg  per day.  Excess sodium may also raise your blood pressure.    Please be sure to bring all of your medications with you to every visit; this includes herbal supplements, vitamins, eye drops, and any over-the-counter medications.   Should you have any questions regarding your medications and/or any new or worsening symptoms, please be sure to call the clinic at [phone].   If you believe that you are suffering from a life threatening condition or one that may result in the loss of limb or function, then you should call 911 or proceed to the nearest Emergency Department.   Amlodipine tablets What is this medicine? AMLODIPINE (am LOE di peen) is a calcium-channel blocker. It affects the amount of calcium found in your heart and muscle cells. This relaxes your blood vessels, which can reduce the amount of work the heart has to do. This medicine is used to lower high blood pressure. It is also used to prevent chest pain. This medicine may be used for other purposes; ask your health care provider or pharmacist if you have questions. COMMON BRAND NAME(S): Norvasc What should I tell my health care provider before I take this medicine? They need to know if you have any of these conditions: -heart problems like heart failure or aortic stenosis -liver disease -an unusual or allergic reaction to  amlodipine, other medicines, foods, dyes, or preservatives -pregnant or trying to get pregnant -breast-feeding How should I use this medicine? Take this medicine by mouth with a glass of water. Follow the directions on the prescription label. Take your medicine at regular intervals. Do not take more medicine than directed. Talk to your pediatrician regarding the use of this medicine in children. Special care may be needed. This medicine has been used in children as young as 6. Persons over 51 years old may have a stronger reaction to this medicine and need smaller doses. Overdosage: If you think you have taken too much of this medicine contact a poison control center or emergency room at once. NOTE: This medicine is only for you. Do not share this medicine with others. What if I miss a dose? If you miss a dose, take it as soon as you can. If it is almost time for your next dose, take only that dose. Do not take double or extra doses. What may interact with this medicine? -herbal or dietary supplements -local or general anesthetics -medicines for high blood pressure -medicines for prostate problems -rifampin This list may not describe all possible interactions. Give your health care provider a list of all the medicines, herbs, non-prescription drugs, or dietary supplements you use. Also tell them if you smoke, drink alcohol, or use illegal drugs. Some items may interact with your medicine. What should I watch for while using this medicine? Visit your doctor or health care professional for regular check ups. Check your  blood pressure and pulse rate regularly. Ask your health care professional what your blood pressure and pulse rate should be, and when you should contact him or her. This medicine may make you feel confused, dizzy or lightheaded. Do not drive, use machinery, or do anything that needs mental alertness until you know how this medicine affects you. To reduce the risk of dizzy or  fainting spells, do not sit or stand up quickly, especially if you are an older patient. Avoid alcoholic drinks; they can make you more dizzy. Do not suddenly stop taking amlodipine. Ask your doctor or health care professional how you can gradually reduce the dose. What side effects may I notice from receiving this medicine? Side effects that you should report to your doctor or health care professional as soon as possible: -allergic reactions like skin rash, itching or hives, swelling of the face, lips, or tongue -breathing problems -changes in vision or hearing -chest pain -fast, irregular heartbeat -swelling of legs or ankles Side effects that usually do not require medical attention (report to your doctor or health care professional if they continue or are bothersome): -dry mouth -facial flushing -nausea, vomiting -stomach gas, pain -tired, weak -trouble sleeping This list may not describe all possible side effects. Call your doctor for medical advice about side effects. You may report side effects to FDA at 1-800-FDA-1088. Where should I keep my medicine? Keep out of the reach of children. Store at room temperature between 59 and 86 degrees F (15 and 30 degrees C). Protect from light. Keep container tightly closed. Throw away any unused medicine after the expiration date. NOTE: This sheet is a summary. It may not cover all possible information. If you have questions about this medicine, talk to your doctor, pharmacist, or health care provider.  2015, Elsevier/Gold Standard. (2012-05-04 11:40:58)

## 2014-07-04 NOTE — Progress Notes (Signed)
Patient ID: [name] [name], male   DOB: 09/17/1965, 49 y.o.   MRN: [id number]    Subjective:   Patient ID: [name] [name] male    DOB: 03/07/1966 49 y.o.    MRN: [id number] Health Maintenance Due: There are no preventive care reminders to display for this patient.  _________________________________________________  HPI: Mr.[name] [name] is a 49 y.o. male here for a hospital f/u.  Pt has a PMH outlined below.  Please see problem-based charting assessment and plan note for further details of medical issues addressed at today's visit.  PMH: No past medical history on file.  Medications: Current Outpatient Prescriptions on File Prior to Visit  Medication Sig Dispense Refill  . amoxicillin-clavulanate (AUGMENTIN) 875-125 MG per tablet Take 1 tablet by mouth 2 (two) times daily. 13 tablet 0  . Multiple Vitamin (MULTIVITAMIN WITH MINERALS) TABS tablet Take 1 tablet by mouth daily.     No current facility-administered medications on file prior to visit.    Allergies: No Known Allergies  FH: No family history on file.  SH: History   Social History  . Marital Status: Single    Spouse Name: N/A    Number of Children: N/A  . Years of Education: N/A   Social History Main Topics  . Smoking status: Never Smoker   . Smokeless tobacco: None  . Alcohol Use: 0.0 oz/week    0 Not specified per week     Comment: social  . Drug Use: No  . Sexual Activity: None   Other Topics Concern  . None   Social History Narrative   ** Merged History Encounter **        Review of Systems: Constitutional: Negative for fever, chills and weight loss.  Eyes: Negative for blurred vision.  Respiratory: Negative for cough and shortness of breath.  Cardiovascular: Negative for chest pain, palpitations and leg swelling.  Gastrointestinal: Negative for nausea, vomiting, abdominal pain, diarrhea, constipation and blood in stool.  Genitourinary: Negative for dysuria, urgency and frequency.  Musculoskeletal:  Negative for myalgias and back pain.  Neurological: Negative for dizziness, weakness and headaches.     Objective:   Vital Signs: Filed Vitals:   07/04/14 1410 07/04/14 1500  BP: 180/97 170/93  Pulse: 72 69  Temp: 97.7 F (36.5 C)   TempSrc: Oral   Weight: 189 lb 6.4 oz (85.911 kg)   SpO2: 100%       BP Readings from Last 3 Encounters:  07/04/14 170/93  07/01/14 152/72  02/22/14 168/97    Physical Exam: Constitutional: Vital signs reviewed.  Patient is well-developed and well-nourished in NAD and cooperative with exam.  Head: Normocephalic and atraumatic. Eyes: PERRL, EOMI, conjunctivae nl, no scleral icterus.  Throat:  Neck: Supple. Cardiovascular: RRR, no MRG. Pulmonary/Chest: normal effort, non-tender to palpation, CTAB, no wheezes, rales, or rhonchi. Abdominal: Soft. NT/ND +BS. Neurological: A&O x3, cranial nerves II-XII are grossly intact, moving all extremities. Extremities: 2+DP b/l; no pitting edema. Skin: Warm, dry and intact. No rash.   Assessment & Plan:   Assessment and plan was discussed and formulated with my attending.

## 2014-07-05 LAB — URINALYSIS, ROUTINE W REFLEX MICROSCOPIC
BILIRUBIN URINE: NEGATIVE
GLUCOSE, UA: NEGATIVE mg/dL
Hgb urine dipstick: NEGATIVE
Ketones, ur: NEGATIVE mg/dL
LEUKOCYTES UA: NEGATIVE
NITRITE: NEGATIVE
Protein, ur: NEGATIVE mg/dL
Specific Gravity, Urine: 1.019 (ref 1.005–1.030)
Urobilinogen, UA: 0.2 mg/dL (ref 0.0–1.0)
pH: 5 (ref 5.0–8.0)

## 2014-07-05 LAB — HIV ANTIBODY (ROUTINE TESTING W REFLEX): HIV 1&2 Ab, 4th Generation: NONREACTIVE

## 2014-07-08 NOTE — Progress Notes (Signed)
Internal Medicine Clinic Attending  Case discussed with Dr. Gill soon after the resident saw the patient.  We reviewed the resident's history and exam and pertinent patient test results.  I agree with the assessment, diagnosis, and plan of care documented in the resident's note. Dr Gill's clinic note delayed but will be coming shortly.  Shayan Bramhall, MD  

## 2014-07-18 ENCOUNTER — Ambulatory Visit (INDEPENDENT_AMBULATORY_CARE_PROVIDER_SITE_OTHER): Payer: Managed Care, Other (non HMO) | Admitting: Internal Medicine

## 2014-07-18 ENCOUNTER — Encounter: Payer: Self-pay | Admitting: Internal Medicine

## 2014-07-18 VITALS — BP 160/101 | HR 80 | Temp 98.4°F | Ht 68.0 in | Wt 185.1 lb

## 2014-07-18 DIAGNOSIS — R7989 Other specified abnormal findings of blood chemistry: Secondary | ICD-10-CM | POA: Insufficient documentation

## 2014-07-18 DIAGNOSIS — R945 Abnormal results of liver function studies: Secondary | ICD-10-CM

## 2014-07-18 DIAGNOSIS — I10 Essential (primary) hypertension: Secondary | ICD-10-CM

## 2014-07-18 DIAGNOSIS — N179 Acute kidney failure, unspecified: Secondary | ICD-10-CM | POA: Diagnosis not present

## 2014-07-18 LAB — COMPLETE METABOLIC PANEL WITH GFR
ALK PHOS: 87 U/L (ref 39–117)
ALT: 72 U/L — ABNORMAL HIGH (ref 0–53)
AST: 37 U/L (ref 0–37)
Albumin: 4 g/dL (ref 3.5–5.2)
BUN: 20 mg/dL (ref 6–23)
CALCIUM: 9.5 mg/dL (ref 8.4–10.5)
CHLORIDE: 102 meq/L (ref 96–112)
CO2: 25 mEq/L (ref 19–32)
CREATININE: 1.34 mg/dL (ref 0.50–1.35)
GFR, Est African American: 71 mL/min
GFR, Est Non African American: 61 mL/min
GLUCOSE: 87 mg/dL (ref 70–99)
POTASSIUM: 4.5 meq/L (ref 3.5–5.3)
Sodium: 136 mEq/L (ref 135–145)
Total Bilirubin: 0.3 mg/dL (ref 0.2–1.2)
Total Protein: 7.5 g/dL (ref 6.0–8.3)

## 2014-07-18 MED ORDER — AMLODIPINE BESYLATE 10 MG PO TABS: 5.0000 mg | ORAL_TABLET | Freq: Every day | ORAL | Status: DC

## 2014-07-18 NOTE — Progress Notes (Signed)
   Subjective:    Patient ID: [name] [name], male    DOB: 07/12/1965, 49 y.o.   MRN: [id number]  HPI [name] Lamingsaac Dupin is a 49 year old man with history of acute epiglottitis and hypertension presenting for follow-up of his blood pressure.  He was discharged from the hospital on 06/30/2014 following treatment for epiglottitis requiring intubation. He had acute kidney injury during that hospitalization. He was seen in follow-up on 07/04/2014. The note from that visit has not been written yet. However, it appears his blood pressure was elevated to 170/93. He was started on amlodipine 5 mg daily. His kidney function was improving at that time with creatinine down to 3.02 from 3.57 at discharge.  He reports doing well without any problems.  He has been taking his amlodipine 5 mg every day.  He denies headaches, visual changes, chest pain, trouble breathing, throat pain, abdominal pain, nausea, vomiting, changes in his bowel movements or urination, fevers, or chills.  Review of Systems  Constitutional: Negative for fever, chills and diaphoresis.  HENT: Negative for congestion, rhinorrhea and sore throat.   Respiratory: Negative for cough and shortness of breath.   Cardiovascular: Negative for leg swelling.  Gastrointestinal: Negative for nausea, vomiting, abdominal pain, diarrhea and constipation.  Genitourinary: Negative for dysuria.  Musculoskeletal: Negative for myalgias and arthralgias.  Skin: Negative for rash.  Neurological: Negative for dizziness, weakness, numbness and headaches.       Objective:   Physical Exam  Constitutional: He is oriented to person, place, and time. He appears well-developed and well-nourished. No distress.  HENT:  Head: Normocephalic and atraumatic.  Eyes: Conjunctivae and EOM are normal. Pupils are equal, round, and reactive to light. No scleral icterus.  Neck: Normal range of motion. Neck supple.  Cardiovascular: Normal rate, regular rhythm and normal heart sounds.    Pulmonary/Chest: Effort normal and breath sounds normal. No respiratory distress. He has no wheezes.  Abdominal: Soft. Bowel sounds are normal. He exhibits no distension. There is no tenderness.  Musculoskeletal: Normal range of motion. He exhibits no edema or tenderness.  Lymphadenopathy:    He has no cervical adenopathy.  Neurological: He is alert and oriented to person, place, and time. No cranial nerve deficit. He exhibits normal muscle tone.  Skin: Skin is warm and dry. No rash noted. No erythema.          Assessment & Plan:  Please see problem-based assessment and plan.

## 2014-07-18 NOTE — Assessment & Plan Note (Signed)
BP Readings from Last 3 Encounters:  07/18/14 160/101  07/04/14 170/93  07/01/14 152/72    Lab Results  Component Value Date   NA 137 07/04/2014   K 4.0 07/04/2014   CREATININE 3.02* 07/04/2014    Assessment: Blood pressure control: moderately elevated Progress toward BP goal:  improved Comments: Blood pressure improved upon recheck.He's been compliant with his amlodipine. No symptoms currently.  Plan: Medications:  Increase amlodipine to 10 mg daily. Educational resources provided: brochure Self management tools provided: home blood pressure logbook Other plans: Returning two weeks for blood pressure recheck.

## 2014-07-18 NOTE — Progress Notes (Signed)
Internal Medicine Clinic Attending  Case discussed with Dr. Moding at the time of the visit.  We reviewed the resident's history and exam and pertinent patient test results.  I agree with the assessment, diagnosis, and plan of care documented in the resident's note. 

## 2014-07-18 NOTE — Assessment & Plan Note (Signed)
Lab Results  Component Value Date   CREATININE 3.02* 07/04/2014   CREATININE 3.57* 07/01/2014   CREATININE 3.67* 06/30/2014   His creatinine continues to improve. His baseline was 1.18 on admission to the hospital. -Recheck CMP today. -Return to clinic in two weeks for repeat measurement.

## 2014-07-18 NOTE — Patient Instructions (Addendum)
Thank you for coming to clinic today Mr. [name] [name].  General instructions: -Your blood pressure is still elevated today so I would like you to increase your amlodipine to 10 mg daily. -I sent a new prescription to her pharmacy, but you can take 2 of the 5 mg tablets daily until you ran out of those. -I would also like to recheck your labs today. -Please make a follow up appointment to return to clinic in 2 weeks to recheck your blood pressure and labs.  Thank you for bringing your medicines today. This helps us keep you safe from mistakes.

## 2014-07-18 NOTE — Assessment & Plan Note (Signed)
Isolated elevation of ALT on last CMP. Cause unclear. We'll continue to monitor. -Check CMP today.

## 2014-07-21 NOTE — Assessment & Plan Note (Signed)
BP elevated. -add amlodipine 5mg  daily, return for a recheck

## 2014-07-21 NOTE — Assessment & Plan Note (Signed)
needs colonoscopy

## 2014-07-21 NOTE — Assessment & Plan Note (Signed)
-  recheck BMP -advised to avoid nephrotoxins

## 2014-08-01 ENCOUNTER — Ambulatory Visit: Payer: Self-pay | Admitting: Internal Medicine

## 2014-08-01 ENCOUNTER — Encounter: Payer: Self-pay | Admitting: Internal Medicine

## 2014-08-01 ENCOUNTER — Ambulatory Visit (INDEPENDENT_AMBULATORY_CARE_PROVIDER_SITE_OTHER): Payer: Managed Care, Other (non HMO) | Admitting: Internal Medicine

## 2014-08-01 VITALS — BP 173/96 | HR 73 | Temp 98.5°F | Ht 68.0 in | Wt 187.8 lb

## 2014-08-01 DIAGNOSIS — I10 Essential (primary) hypertension: Secondary | ICD-10-CM

## 2014-08-01 MED ORDER — AMLODIPINE BESYLATE 10 MG PO TABS
10.0000 mg | ORAL_TABLET | Freq: Every day | ORAL | Status: AC
Start: 2014-08-01 — End: 2015-08-01

## 2014-08-01 NOTE — Patient Instructions (Signed)
General Instructions:   Please try to bring all your medicines next time. This will help us keep you safe from mistakes.   Progress Toward Treatment Goals:  Treatment Goal 07/18/2014  Blood pressure improved    Self Care Goals & Plans:  Self Care Goal 08/01/2014  Manage my medications take my medicines as prescribed; bring my medications to every visit; refill my medications on time  Eat healthy foods drink diet soda or water instead of juice or soda; eat more vegetables; eat foods that are low in salt; eat baked foods instead of fried foods; eat fruit for snacks and desserts    No flowsheet data found.   Care Management & Community Referrals:  No flowsheet data found.

## 2014-08-01 NOTE — Assessment & Plan Note (Signed)
BP Readings from Last 3 Encounters:  08/01/14 173/96  07/18/14 160/101  07/04/14 170/93    Lab Results  Component Value Date   NA 136 07/18/2014   K 4.5 07/18/2014   CREATININE 1.34 07/18/2014    Assessment: Blood pressure control:   Progress toward BP goal:    Comments: Patient did not have any medications from the pharmacy  Plan: Medications:  Amlodipine 10 mg every day Educational resources provided: brochure (denies) Self management tools provided:   Other plans: Follow-up in 2 weeks for reassessment, most updated labs were reviewed with the patient that showed normalization of creatinine and alkaline phosphatase that had previously been elevated during hospitalization

## 2014-08-01 NOTE — Progress Notes (Signed)
   Subjective:   Patient ID: [name] [name] male   DOB: 08/04/1965 49 y.o.   MRN: [id number]  HPI: Mr.[name] [name] is a 49 y.o. man pmh as listed below coming in for HTN.   Hypertension ROS: not taking medications regularly as instructed, patient does not perform home BP monitoring, no TIA's, no chest pain on exertion, no dyspnea on exertion and no swelling of ankles. Patient states that he did not pick up any of his medications from the pharmacy as it was unavailable.  No past medical history on file. Current Outpatient Prescriptions  Medication Sig Dispense Refill  . amLODipine (NORVASC) 10 MG tablet Take 1 tablet (10 mg total) by mouth daily. 30 tablet 1  . Multiple Vitamin (MULTIVITAMIN WITH MINERALS) TABS tablet Take 1 tablet by mouth daily.     No current facility-administered medications for this visit.   No family history on file. History   Social History  . Marital Status: Single    Spouse Name: N/A  . Number of Children: N/A  . Years of Education: N/A   Social History Main Topics  . Smoking status: Never Smoker   . Smokeless tobacco: Not on file  . Alcohol Use: 0.0 oz/week    0 Standard drinks or equivalent per week     Comment: social  . Drug Use: No  . Sexual Activity: Not on file   Other Topics Concern  . None   Social History Narrative   ** Merged History Encounter **       Review of Systems: Pertinent items are noted in HPI. Objective:  Physical Exam: Filed Vitals:   08/01/14 1611  BP: 173/96  Pulse: 73  Temp: 98.5 F (36.9 C)  TempSrc: Oral  Height: 5\' 8"  (1.727 m)  Weight: 187 lb 12.8 oz (85.186 kg)  SpO2: 100%   General: sitting in chair, NAD  Cardiac: RRR, no rubs, murmurs or gallops Pulm: clear to auscultation bilaterally, no crackles, wheezes or rhonchi, moving normal volumes of air Ext: warm and well perfused, no pedal edema  Assessment & Plan:  Please see problem oriented charting  Pt discussed with Dr. Heide SparkNarendra

## 2014-08-05 NOTE — Progress Notes (Signed)
INTERNAL MEDICINE TEACHING ATTENDING ADDENDUM - Amro Winebarger, MD: I reviewed and discussed at the time of visit with the resident Dr. Sadek, the patient's medical history, physical examination, diagnosis and results of pertinent tests and treatment and I agree with the patient's care as documented.  

## 2014-09-18 ENCOUNTER — Encounter: Payer: Self-pay | Admitting: Internal Medicine

## 2015-01-22 ENCOUNTER — Encounter: Payer: Self-pay | Admitting: Internal Medicine

## 2015-07-19 IMAGING — CT CT NECK W/ CM
4 of 5 series · 14 of 33 positions shown, 16 images · IV contrast (Omni 300)
Comparison: Soft tissue neck 06/26/2014 and chest x-ray same date

CLINICAL DATA: Stridor with sore throat.

EXAM:
CT NECK WITH CONTRAST
TECHNIQUE: Multidetector CT imaging of the neck was performed using the
standard protocol following the bolus administration of intravenous
contrast.
CONTRAST:  75mL OMNIPAQUE IOHEXOL 300 MG/ML  SOLN

[Series 3: neck 2.0 i31s 3 · axial · 0.55mm/px · z∈[+1223,+1359]mm · 3 of 137 slices shown, 4 images]
[im 35/137  soft-tissue]
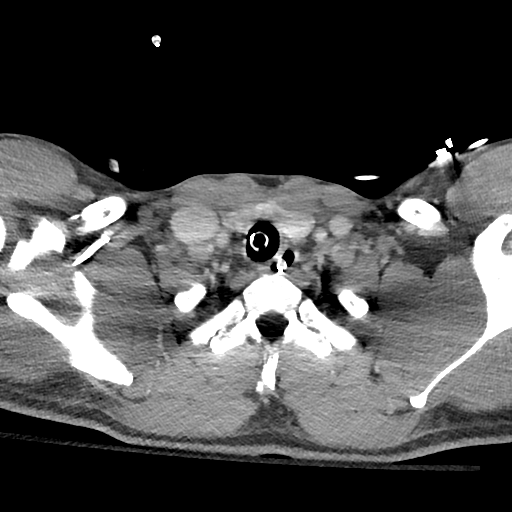
[im 35/137  bone]
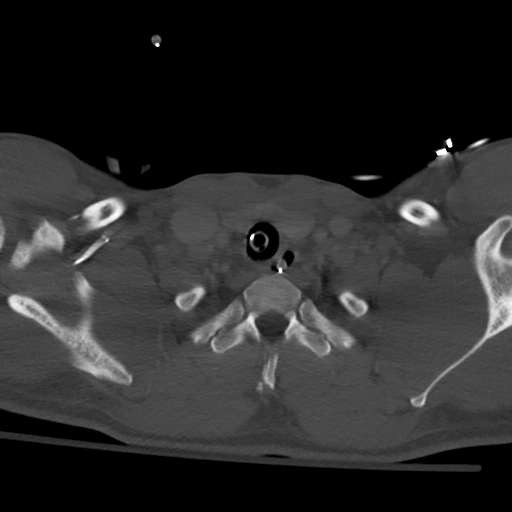
[im 69/137  bone]
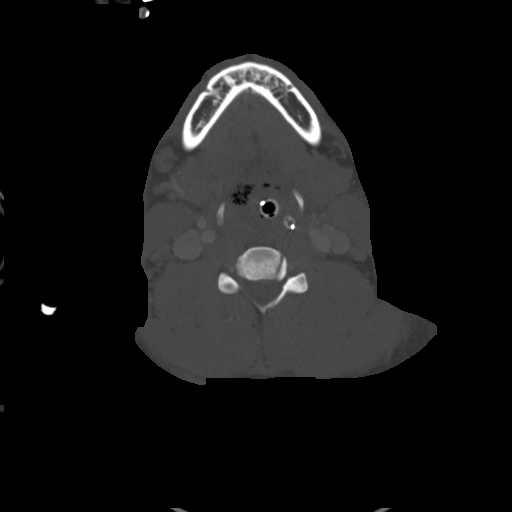
[im 103/137  bone]
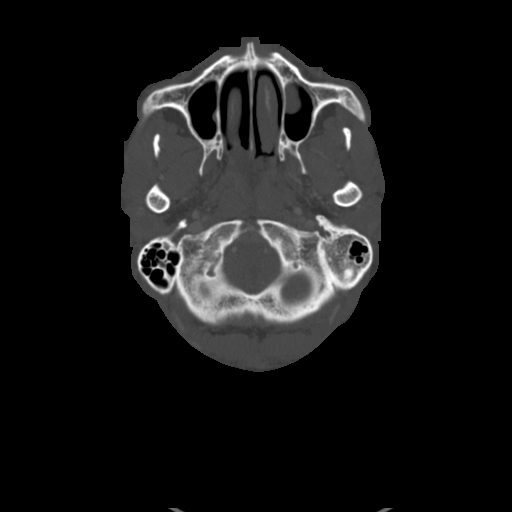

[Series 4: neck 2.0 i70h 3 · axial · 0.55mm/px · z∈[+1224,+1360]mm · 3 of 138 slices shown]
[im 35/138  bone]
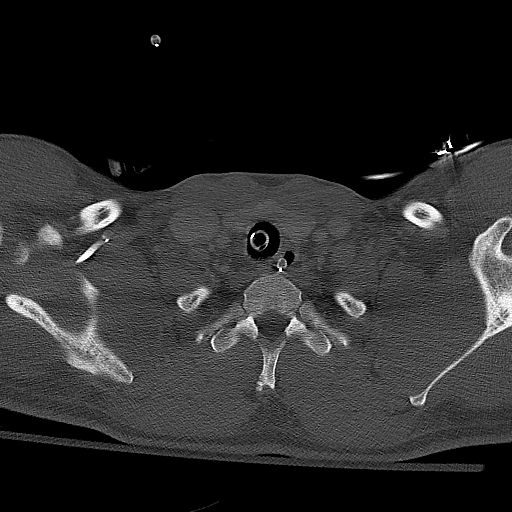
[im 69/138  bone]
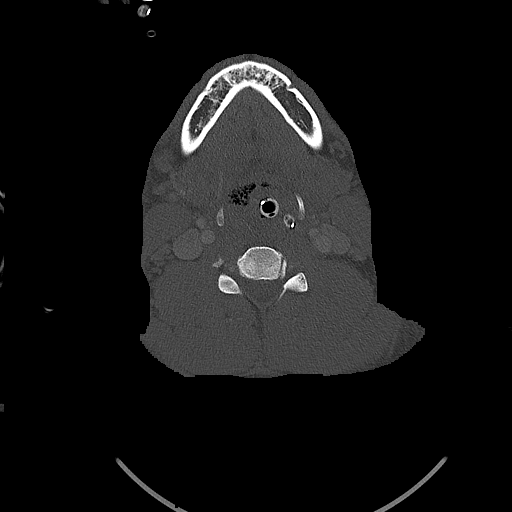
[im 103/138  bone]
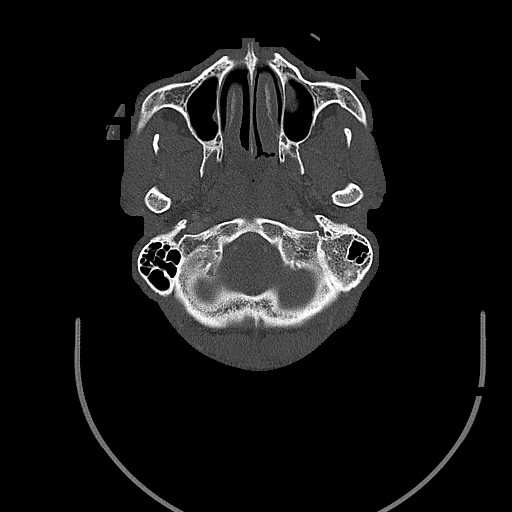

[Series 6: coronal st · coronal · 0.39mm/px · 3 of 95 slices shown]
[im 19/95  bone]
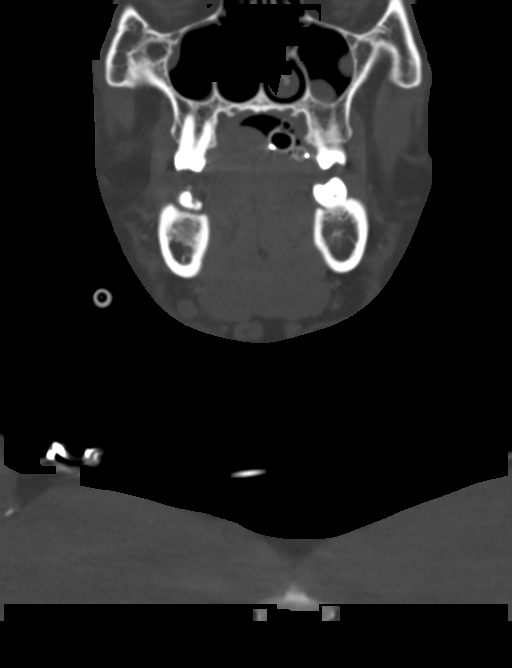
[im 38/95  bone]
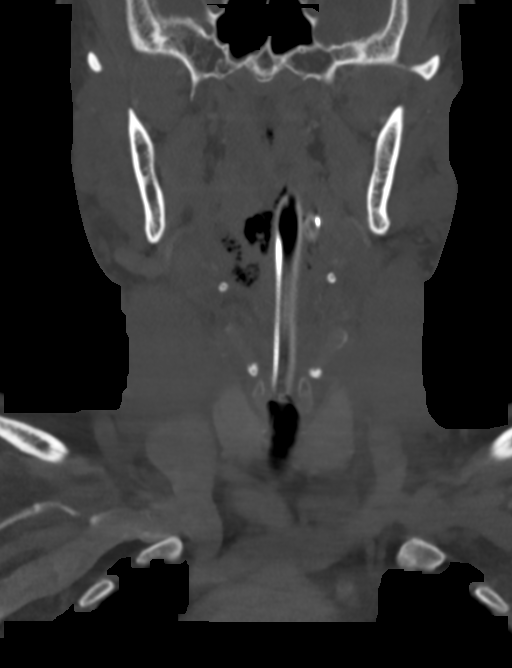
[im 57/95  bone]
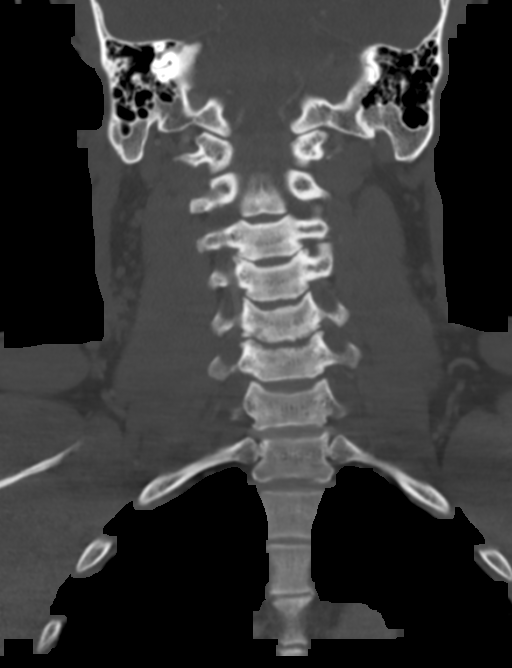

[Series 7: sagittal st · sagittal · 0.39mm/px · 5 of 101 slices shown, 6 images]
[im 34/101  bone]
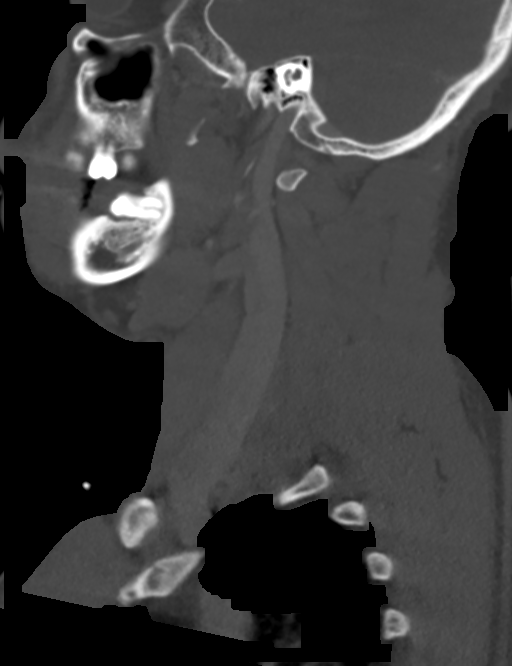
[im 42/101  bone]
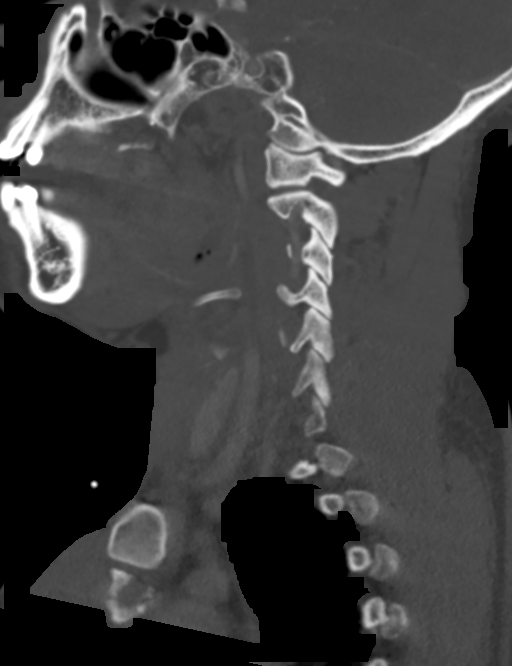
[im 51/101  soft-tissue]
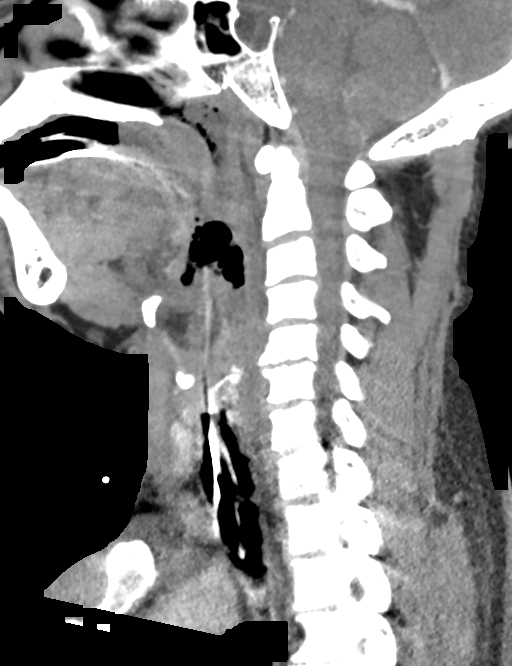
[im 51/101  bone]
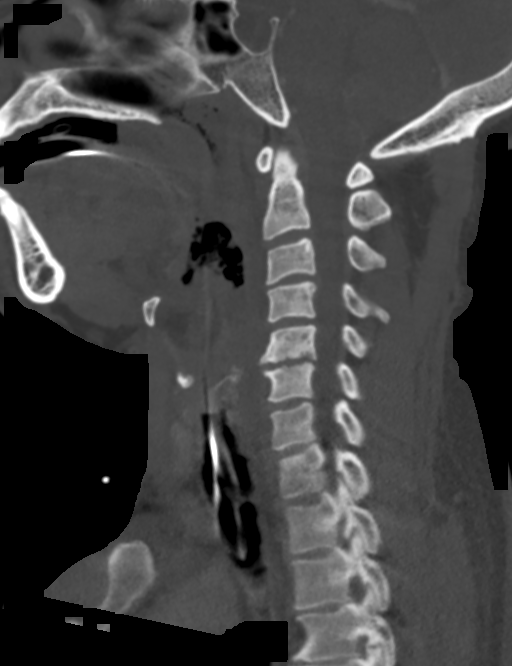
[im 59/101  bone]
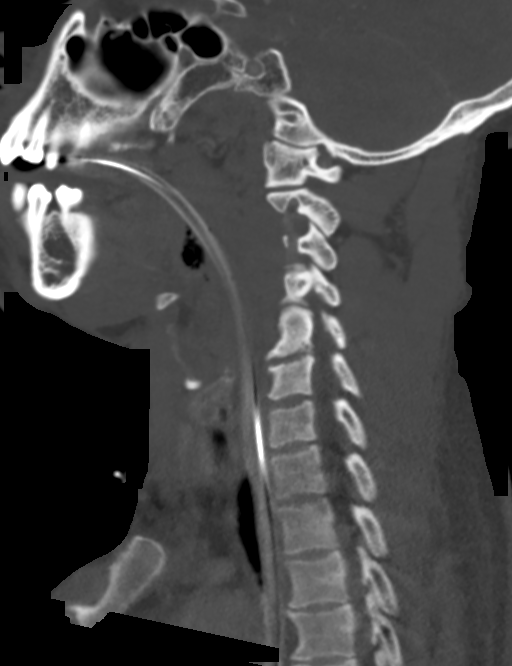
[im 67/101  bone]
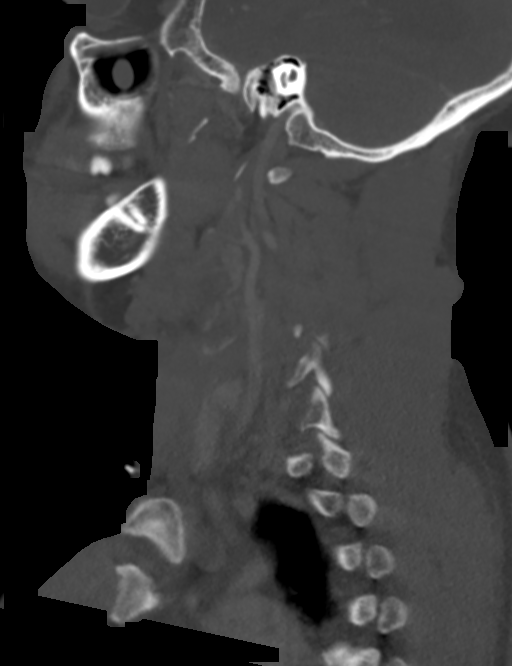

[14 of 33 positions shown; findings below may reference images not displayed]

FINDINGS: The endotracheal and enteric tubes are present. The endotracheal
tube is in adequate position. Visualized orbits are normal and
symmetric.

Pharynx and larynx: There is moderate narrowing of the
nasopharyngeal airway with mild symmetric soft tissue prominence of
the palatine tonsillar tissue in posterior lateral tissues obscuring
the origin of the eustachian tube and fossa of Rosenmuller
bilaterally. There are retained secretions in the midline
nasopharyngeal region adjacent the airway. Retained secretions over
the right piriform sinus. Epiglottis is not well-defined and
partially obscured by the endotracheal tube. Prevertebral soft
tissues/ premucosal space unremarkable. No definite evidence of
tonsillar abscess. Minimal air within the subglottic airway other
than the endotracheal tube down the level of the vocal cords.

Salivary glands: Within normal.

Thyroid: Within normal.

Lymph nodes: Within normal.

Vascular: Within normal.

Limited intracranial: Within normal.

Mastoids and visualized paranasal sinuses: Subtle chronic
inflammatory change over the maxillary and ethmoid sinus.

Skeleton: Minimal spondylosis of the cervical sparse.

Upper chest: Possible subtle hazy suprahilar opacification which may
be due to edema or infection.
IMPRESSION: Moderate narrowing of the nasopharyngeal airway due to adjacent
symmetric prominence of the posterior lateral soft tissues and
palatine tonsillar tissues along with retained secretions. No focal
tonsillar abscess. Narrowing of the subglottic airway around
endotracheal tube as the epiglottis is not well-defined. No
adenopathy or focal mass identified. It would be difficult to
exclude an infectious or inflammatory process involving the
tonsillar tissues or epiglottis.

Mild hazy central suprahilar airspace density which may be due to
edema.

Endotracheal tube and enteric tube as described.

## 2015-07-19 IMAGING — CR DG CHEST 1V PORT
1 series · 1 of 1 positions shown · non-contrast
Comparison: Single view of the chest 06/26/2014 at [DATE] a.m..

CLINICAL DATA: Status post intubation.

EXAM:
PORTABLE CHEST - 1 VIEW

[portable]
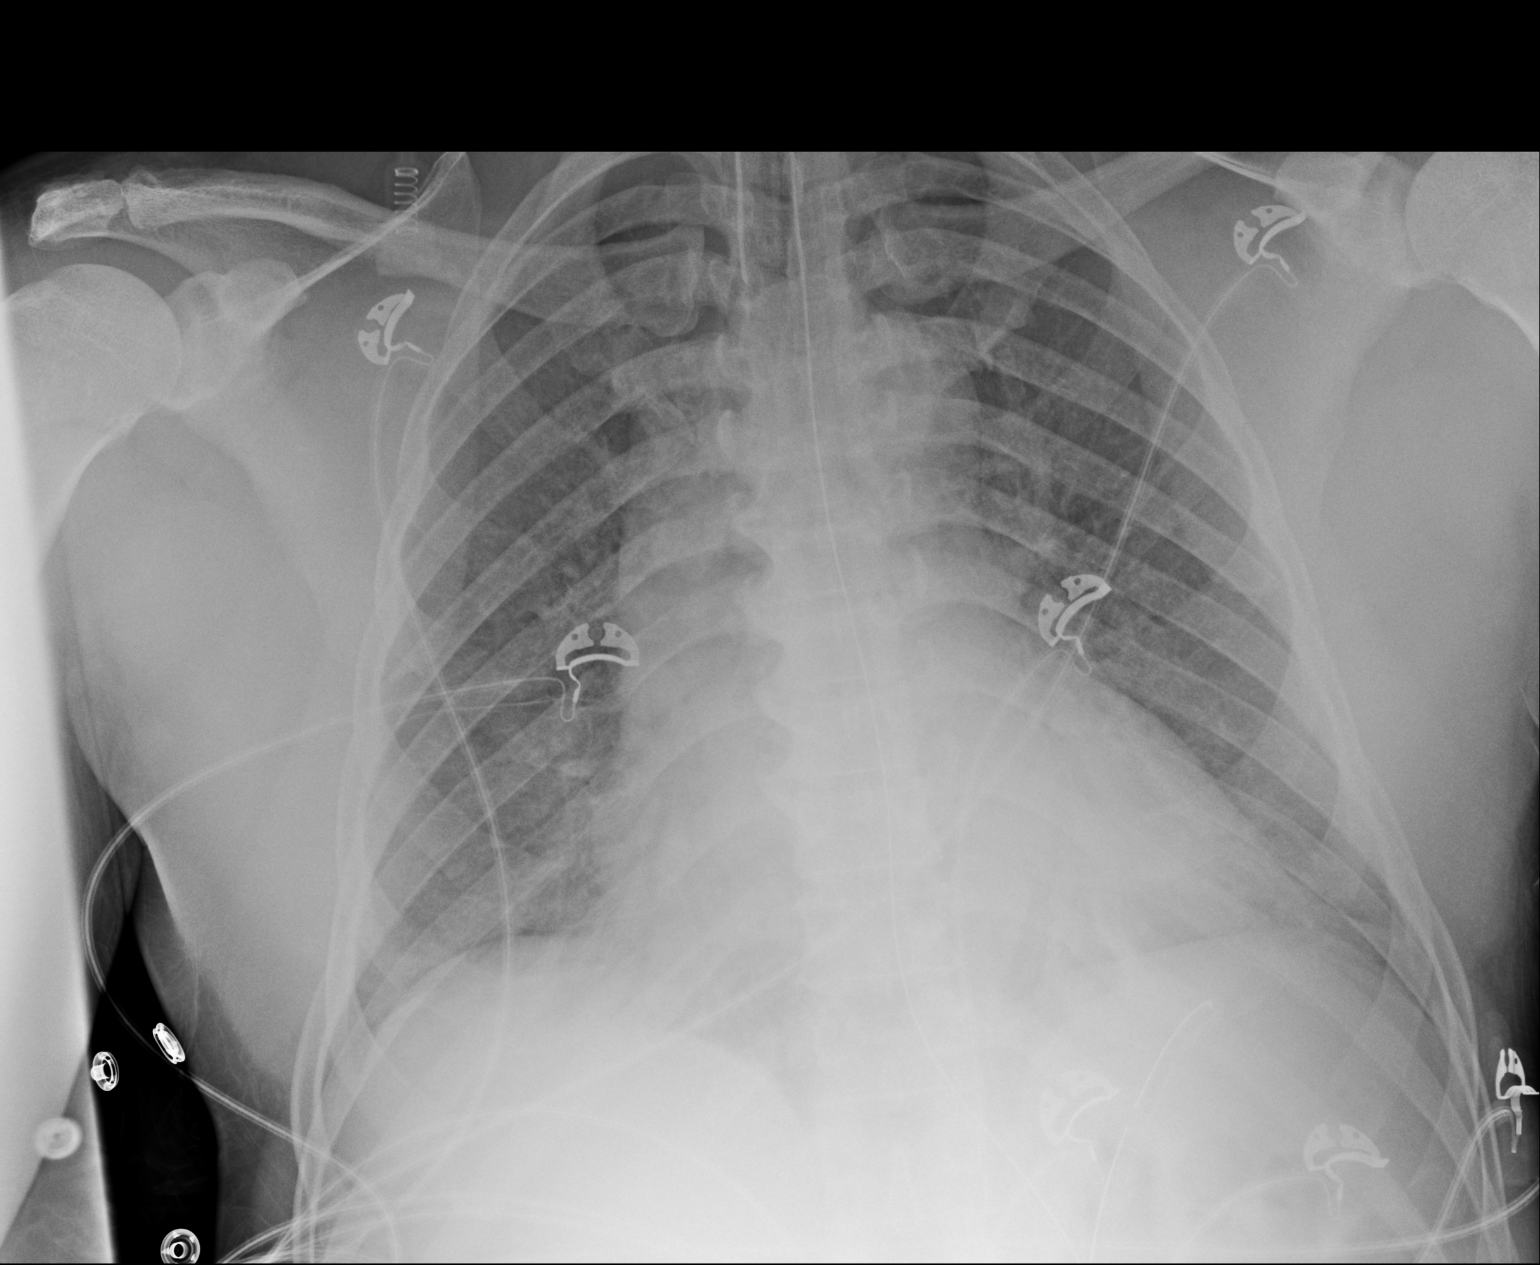

[1 of 1 positions shown; findings below may reference images not displayed]

FINDINGS: A new endotracheal tube is in place with the tip in good position at
the level of the clavicular heads. NG tube is identified with the
tip in the fundus of the stomach. There is mild basilar atelectasis.
Cardiomegaly and vascular congestion appear unchanged. No
pneumothorax or pleural effusion.
IMPRESSION: ET tube and NG tube are in good position.

Cardiomegaly and mild vascular congestion.
